# Patient Record
Sex: Female | Born: 1950
Health system: Southern US, Community
[De-identification: ages and names within clinical notes are randomized; demographics above are authoritative.]

## PROBLEM LIST (undated history)

## (undated) DIAGNOSIS — F419 Anxiety disorder, unspecified: Secondary | ICD-10-CM

## (undated) DIAGNOSIS — C541 Malignant neoplasm of endometrium: Secondary | ICD-10-CM

## (undated) DIAGNOSIS — R011 Cardiac murmur, unspecified: Secondary | ICD-10-CM

## (undated) DIAGNOSIS — F329 Major depressive disorder, single episode, unspecified: Secondary | ICD-10-CM

## (undated) DIAGNOSIS — R3915 Urgency of urination: Secondary | ICD-10-CM

## (undated) DIAGNOSIS — R42 Dizziness and giddiness: Secondary | ICD-10-CM

## (undated) DIAGNOSIS — IMO0002 Reserved for concepts with insufficient information to code with codable children: Secondary | ICD-10-CM

## (undated) DIAGNOSIS — F32A Depression, unspecified: Secondary | ICD-10-CM

## (undated) DIAGNOSIS — K219 Gastro-esophageal reflux disease without esophagitis: Secondary | ICD-10-CM

## (undated) DIAGNOSIS — E0500 Thyrotoxicosis with diffuse goiter without thyrotoxic crisis or storm: Secondary | ICD-10-CM

## (undated) DIAGNOSIS — R7303 Prediabetes: Secondary | ICD-10-CM

## (undated) DIAGNOSIS — IMO0001 Reserved for inherently not codable concepts without codable children: Secondary | ICD-10-CM

## (undated) DIAGNOSIS — M1990 Unspecified osteoarthritis, unspecified site: Secondary | ICD-10-CM

## (undated) DIAGNOSIS — M7989 Other specified soft tissue disorders: Secondary | ICD-10-CM

## (undated) HISTORY — PX: KNEE SURGERY: SHX244

## (undated) HISTORY — PX: CHOLECYSTECTOMY: SHX55

## (undated) HISTORY — PX: COLONOSCOPY: SHX174

---

## 2007-09-26 ENCOUNTER — Emergency Department (HOSPITAL_COMMUNITY): Admission: EM | Admit: 2007-09-26 | Discharge: 2007-09-27 | Payer: Self-pay | Admitting: *Deleted

## 2010-05-30 ENCOUNTER — Encounter: Payer: Self-pay | Admitting: Family Medicine

## 2011-09-17 ENCOUNTER — Ambulatory Visit (INDEPENDENT_AMBULATORY_CARE_PROVIDER_SITE_OTHER): Payer: BC Managed Care – PPO | Admitting: Family Medicine

## 2011-09-17 ENCOUNTER — Ambulatory Visit: Payer: BC Managed Care – PPO

## 2011-09-17 VITALS — BP 109/76 | HR 65 | Temp 97.4°F | Resp 18 | Ht 62.0 in | Wt 214.0 lb

## 2011-09-17 DIAGNOSIS — M25539 Pain in unspecified wrist: Secondary | ICD-10-CM

## 2011-09-17 DIAGNOSIS — M25519 Pain in unspecified shoulder: Secondary | ICD-10-CM

## 2011-09-17 DIAGNOSIS — M25019 Hemarthrosis, unspecified shoulder: Secondary | ICD-10-CM

## 2011-09-17 MED ORDER — MELOXICAM 7.5 MG PO TABS: 7.5000 mg | ORAL_TABLET | Freq: Every day | ORAL | Status: AC

## 2011-09-17 NOTE — Progress Notes (Signed)
  Patient Name: Kendra Fuentes Date of Birth: 06/25/1950 Medical Record Number: [id number] Gender: female Date of Encounter: 09/17/2011  History of Present Illness:  Kendra Fuentes is a 61 y.o. very pleasant female patient who presents with the following:  Here today to evaluate a left shoulder injury. 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   No other injury that she is aware of.  She had a broken collar bone in her teens - no surgery needed for this.  She is otherwise healthy and unhurt- no head injury  There is no problem list on file for this patient.  No past medical history on file. No past surgical history on file. History  Substance Use Topics  . Smoking status: Never Smoker   . Smokeless tobacco: Not on file  . Alcohol Use: Not on file   No family history on file. Allergies  Allergen Reactions  . Penicillins     Medication list has been reviewed and updated.  Review of Systems: As per HPI- otherwise negative.   Physical Examination: Filed Vitals:   09/17/11 1058  BP: 109/76  Pulse: 65  Temp: 97.4 F (36.3 C)  TempSrc: Oral  Resp: 18  Height: 5\' 2"  (1.575 m)  Weight: 214 lb (97.07 kg)    Body mass index is 39.14 kg/(m^2).  GEN: WDWN, NAD, Non-toxic, A & O x 3, obese HEENT: Atraumatic, Normocephalic. Neck supple. No masses, No LAD. Ears and Nose: No external deformity. CV: RRR, No M/G/R. No JVD. No thrill. No extra heart sounds. PULM: CTA B, no wheezes, crackles, rhonchi. No retractions. No resp. distress. No accessory muscle use. EXTR: No c/c/e NEURO Normal gait.  PSYCH: Normally interactive. Conversant. Not depressed or anxious appearing.  Calm demeanor.  Left shoulder: tender at anterior RCT insertion.  Negative  ACJ.  Some discomfort with extreme abduction and flexion, but full ROM.  Shoulder is located.  Normal strength, sensation  and biceps reflex in arm.  No redness or swelling  [facility] reading (PRIMARY) by  Dr. Patsy Lager.  Negative left humerus and shoulder  LEFT HUMERUS - 2+ VIEW  Comparison: None  Findings: There is no evidence of fracture or dislocation. There is no evidence of arthropathy or other focal bone abnormality. Soft tissues are unremarkable.  IMPRESSION: Negative exam.  Clinically significant discrepancy from primary report, if provided: None  LEFT SHOULDER - 2+ VIEW  Comparison: None.  Findings: No acute fracture or dislocation identified. Mild degenerative changes involving the [facility] joint and glenohumeral joint. Soft tissues are unremarkable.  IMPRESSION: No acute fracture.  Clinically significant discrepancy from primary report, if provided: None  Assessment and Plan: 1. Shoulder pain  DG Shoulder Left, DG Humerus Left, meloxicam (MOBIC) 7.5 MG tablet   Shoulder contusion and strain.  Sling as needed for comfort, but do not wear all the time- need to maintain shoulder ROM.  She may use mobic as needed, and will let me know if not better in a few days.  If symptoms persist may need further evlauation of her rotator cuff

## 2013-01-21 ENCOUNTER — Emergency Department (HOSPITAL_COMMUNITY)
Admission: EM | Admit: 2013-01-21 | Discharge: 2013-01-21 | Disposition: A | Discharge status: Home / Self Care | Payer: BC Managed Care – PPO | Attending: Emergency Medicine | Admitting: Emergency Medicine

## 2013-01-21 ENCOUNTER — Encounter (HOSPITAL_COMMUNITY): Payer: Self-pay | Admitting: *Deleted

## 2013-01-21 DIAGNOSIS — R42 Dizziness and giddiness: Secondary | ICD-10-CM | POA: Insufficient documentation

## 2013-01-21 DIAGNOSIS — Z862 Personal history of diseases of the blood and blood-forming organs and certain disorders involving the immune mechanism: Secondary | ICD-10-CM | POA: Insufficient documentation

## 2013-01-21 DIAGNOSIS — Z880 Allergy status to penicillin: Secondary | ICD-10-CM | POA: Insufficient documentation

## 2013-01-21 DIAGNOSIS — W19XXXA Unspecified fall, initial encounter: Secondary | ICD-10-CM

## 2013-01-21 DIAGNOSIS — T148XXA Other injury of unspecified body region, initial encounter: Secondary | ICD-10-CM

## 2013-01-21 DIAGNOSIS — IMO0002 Reserved for concepts with insufficient information to code with codable children: Secondary | ICD-10-CM | POA: Insufficient documentation

## 2013-01-21 DIAGNOSIS — Z8639 Personal history of other endocrine, nutritional and metabolic disease: Secondary | ICD-10-CM | POA: Insufficient documentation

## 2013-01-21 DIAGNOSIS — Y9351 Activity, roller skating (inline) and skateboarding: Secondary | ICD-10-CM | POA: Insufficient documentation

## 2013-01-21 DIAGNOSIS — Y9239 Other specified sports and athletic area as the place of occurrence of the external cause: Secondary | ICD-10-CM | POA: Insufficient documentation

## 2013-01-21 DIAGNOSIS — Z79899 Other long term (current) drug therapy: Secondary | ICD-10-CM | POA: Insufficient documentation

## 2013-01-21 HISTORY — DX: Thyrotoxicosis with diffuse goiter without thyrotoxic crisis or storm: E05.00

## 2013-01-21 LAB — CBC WITH DIFFERENTIAL/PLATELET
Basophils Absolute: 0 10*3/uL (ref 0.0–0.1)
Basophils Relative: 0 % (ref 0–1)
Lymphocytes Relative: 8 % — ABNORMAL LOW (ref 12–46)
MCHC: 33.1 g/dL (ref 30.0–36.0)
Neutro Abs: 9.3 10*3/uL — ABNORMAL HIGH (ref 1.7–7.7)
Platelets: 257 10*3/uL (ref 150–400)
RDW: 13.9 % (ref 11.5–15.5)
WBC: 11.2 10*3/uL — ABNORMAL HIGH (ref 4.0–10.5)

## 2013-01-21 LAB — BASIC METABOLIC PANEL
CO2: 27 mEq/L (ref 19–32)
Calcium: 9.9 mg/dL (ref 8.4–10.5)
Chloride: 102 mEq/L (ref 96–112)
GFR calc Af Amer: 63 mL/min — ABNORMAL LOW (ref >90)
Sodium: 137 mEq/L (ref 135–145)

## 2013-01-21 LAB — TROPONIN I: Troponin I: <0.3 ng/mL (ref <0.30)

## 2013-01-21 MED ORDER — KETOROLAC TROMETHAMINE 30 MG/ML IJ SOLN: 30.0000 mg | Freq: Once | INTRAMUSCULAR | Status: DC

## 2013-01-21 MED ORDER — OXYCODONE-ACETAMINOPHEN 5-325 MG PO TABS: 1.0000 | ORAL_TABLET | ORAL | Status: DC | PRN

## 2013-01-21 MED ORDER — ONDANSETRON 8 MG PO TBDP
8.0000 mg | ORAL_TABLET | Freq: Once | ORAL | Status: AC
  Administered 2013-01-21: 8 mg via ORAL
  Filled 2013-01-21: qty 1

## 2013-01-21 MED ORDER — MORPHINE SULFATE 4 MG/ML IJ SOLN: 4.0000 mg | Freq: Once | INTRAMUSCULAR | Status: DC

## 2013-01-21 MED ORDER — IBUPROFEN 800 MG PO TABS
800.0000 mg | ORAL_TABLET | Freq: Once | ORAL | Status: AC
  Administered 2013-01-21: 800 mg via ORAL
  Filled 2013-01-21: qty 1

## 2013-01-21 MED ORDER — SODIUM CHLORIDE 0.9 % IV BOLUS (SEPSIS): 1000.0000 mL | Freq: Once | INTRAVENOUS | Status: DC

## 2013-01-21 MED ORDER — CYCLOBENZAPRINE HCL 10 MG PO TABS: 10.0000 mg | ORAL_TABLET | Freq: Two times a day (BID) | ORAL | Status: DC | PRN

## 2013-01-21 MED ORDER — ONDANSETRON 4 MG PO TBDP: ORAL_TABLET | ORAL | Status: DC

## 2013-01-21 MED ORDER — IBUPROFEN 800 MG PO TABS: 800.0000 mg | ORAL_TABLET | Freq: Three times a day (TID) | ORAL | Status: DC

## 2013-01-21 MED ORDER — OXYCODONE-ACETAMINOPHEN 5-325 MG PO TABS
2.0000 | ORAL_TABLET | Freq: Once | ORAL | Status: AC
  Administered 2013-01-21: 2 via ORAL
  Filled 2013-01-21: qty 2

## 2013-01-21 NOTE — ED Provider Notes (Signed)
CSN: [id number]     Arrival date & time 01/21/13  1955 History   First MD Initiated Contact with Patient 01/21/13 2042     Chief Complaint  Patient presents with  . Fall   (Consider location/radiation/quality/duration/timing/severity/associated sxs/prior Treatment) The history is provided by the patient.  Shernell Saldierna is a 62 y.o. female history of Graves' disease here presenting with L buttock pain after fall and lightheadedness. 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 Denies any head injury or back injury. Plenty of pain radiating down from the left buttock to  her calf. She initially was hypotensive in the scene and felt like she gone passed out but now feels better. Denies headache or abdominal pain or vomiting.   Past Medical History  Diagnosis Date  . Graves disease    Past Surgical History  Procedure Laterality Date  . Cholecystectomy    . Knee surgery     No family history on file. History  Substance Use Topics  . Smoking status: Never Smoker   . Smokeless tobacco: Not on file  . Alcohol Use: No   OB History   Grav Para Term Preterm Abortions TAB SAB Ect Mult Living                 Review of Systems  Musculoskeletal:       L leg pain   All other systems reviewed and are negative.    Allergies  Penicillins  Home Medications   Current Outpatient Rx  Name  Route  Sig  Dispense  Refill  . citalopram (CELEXA) 20 MG tablet   Oral   Take 20 mg by mouth daily.         . diphenhydrAMINE (BENADRYL) 25 mg capsule   Oral   Take 25 mg by mouth every 6 (six) hours as needed for itching.         . levothyroxine (SYNTHROID, LEVOTHROID) 150 MCG tablet   Oral   Take 150 mcg by mouth daily before breakfast.          BP 136/63  Pulse 91  Temp(Src) 98.2 F (36.8 C) (Oral)  Resp 16  SpO2 100% Physical Exam  Nursing note and vitals reviewed. Constitutional: She is oriented to person, place, and  time. She appears well-developed and well-nourished.  Slightly uncomfortable   HENT:  Head: Normocephalic and atraumatic.  Mouth/Throat: Oropharynx is clear and moist.  Eyes: Conjunctivae are normal. Pupils are equal, round, and reactive to light.  Neck: Normal range of motion. Neck supple.  Cardiovascular: Normal rate, regular rhythm and normal heart sounds.   Pulmonary/Chest: Effort normal and breath sounds normal. No respiratory distress. She has no wheezes. She has no rales.  Abdominal: Soft. Bowel sounds are normal. She exhibits no distension. There is no tenderness. There is no rebound and no guarding.  Musculoskeletal:  No midline spinal tenderness. + tenderness L buttock area.   Neurological: She is alert and oriented to person, place, and time.  + straight leg raise L leg. + tenderness L inner thigh. Nl sensation and nl motor function.   Skin: Skin is warm and dry.  Psychiatric: She has a normal mood and affect. Her behavior is normal. Judgment and thought content normal.    ED Course  Procedures (including critical care time) Labs Review Labs Reviewed  CBC WITH DIFFERENTIAL - Abnormal; Notable for the following:    WBC 11.2 (*)    Neutrophils  Relative % 83 (*)    Neutro Abs 9.3 (*)    Lymphocytes Relative 8 (*)    All other components within normal limits  BASIC METABOLIC PANEL - Abnormal; Notable for the following:    Glucose, Bld 140 (*)    GFR calc non Af Amer 55 (*)    GFR calc Af Amer 63 (*)    All other components within normal limits  TROPONIN I   Imaging Review No results found.   Date: 01/21/2013  Rate: 66  Rhythm: normal sinus rhythm  QRS Axis: normal  Intervals: normal  ST/T Wave abnormalities: nonspecific ST changes  Conduction Disutrbances:none  Narrative Interpretation:   Old EKG Reviewed: none available    MDM  No diagnosis found. Yanissa Michalsky is a 62 y.o. female here with fall with L leg pain. Likely sciatica from muscle strain. I wanted to  hydrate patient and get labs. However, she really doesn't want needles and said that she didn't pass out and was overwhelmed after the fall. Will give pain meds and check orthostatics.   11:07 PM She was orthostatic but still doesn't want IVF and drank some PO fluid instead. Labs normal except borderline elevated glucose. Pain improved, able to ambulate. Will d/c home on motrin, percocet, flexeril, prn zofran. She will f/u with pmd in a week for glucose check and reassessment.      Richardean Canal, MD 01/21/13 [phone]

## 2013-01-21 NOTE — ED Notes (Signed)
Pt roller skating; tripped and did a split; feels like pulled muscles from left buttock down to left knee; numb and painful; pt feeling like she was going to pass out after fall; hypotensive when standing in triage; states continues to feel like she is going to pass out

## 2013-09-16 ENCOUNTER — Emergency Department (HOSPITAL_COMMUNITY): Payer: BC Managed Care – PPO

## 2013-09-16 ENCOUNTER — Encounter (HOSPITAL_COMMUNITY): Payer: Self-pay | Admitting: Radiology

## 2013-09-16 ENCOUNTER — Emergency Department (HOSPITAL_COMMUNITY)
Admission: EM | Admit: 2013-09-16 | Discharge: 2013-09-16 | Disposition: A | Discharge status: Home / Self Care | Payer: BC Managed Care – PPO | Attending: Emergency Medicine | Admitting: Emergency Medicine

## 2013-09-16 DIAGNOSIS — Z880 Allergy status to penicillin: Secondary | ICD-10-CM | POA: Insufficient documentation

## 2013-09-16 DIAGNOSIS — Y9239 Other specified sports and athletic area as the place of occurrence of the external cause: Secondary | ICD-10-CM | POA: Insufficient documentation

## 2013-09-16 DIAGNOSIS — S59919A Unspecified injury of unspecified forearm, initial encounter: Secondary | ICD-10-CM

## 2013-09-16 DIAGNOSIS — Y9389 Activity, other specified: Secondary | ICD-10-CM | POA: Insufficient documentation

## 2013-09-16 DIAGNOSIS — Z79899 Other long term (current) drug therapy: Secondary | ICD-10-CM | POA: Insufficient documentation

## 2013-09-16 DIAGNOSIS — R296 Repeated falls: Secondary | ICD-10-CM | POA: Insufficient documentation

## 2013-09-16 DIAGNOSIS — S6990XA Unspecified injury of unspecified wrist, hand and finger(s), initial encounter: Secondary | ICD-10-CM

## 2013-09-16 DIAGNOSIS — S52599A Other fractures of lower end of unspecified radius, initial encounter for closed fracture: Secondary | ICD-10-CM | POA: Insufficient documentation

## 2013-09-16 DIAGNOSIS — S59909A Unspecified injury of unspecified elbow, initial encounter: Secondary | ICD-10-CM | POA: Insufficient documentation

## 2013-09-16 DIAGNOSIS — E0500 Thyrotoxicosis with diffuse goiter without thyrotoxic crisis or storm: Secondary | ICD-10-CM | POA: Insufficient documentation

## 2013-09-16 DIAGNOSIS — S52502A Unspecified fracture of the lower end of left radius, initial encounter for closed fracture: Secondary | ICD-10-CM

## 2013-09-16 DIAGNOSIS — Y92838 Other recreation area as the place of occurrence of the external cause: Secondary | ICD-10-CM

## 2013-09-16 MED ORDER — OXYCODONE-ACETAMINOPHEN 5-325 MG PO TABS
2.0000 | ORAL_TABLET | Freq: Once | ORAL | Status: AC
  Administered 2013-09-16: 2 via ORAL
  Filled 2013-09-16: qty 2

## 2013-09-16 MED ORDER — HYDROMORPHONE HCL PF 1 MG/ML IJ SOLN
1.0000 mg | Freq: Once | INTRAMUSCULAR | Status: AC
  Administered 2013-09-16: 1 mg via INTRAVENOUS
  Filled 2013-09-16: qty 1

## 2013-09-16 MED ORDER — OXYCODONE-ACETAMINOPHEN 5-325 MG PO TABS: 1.0000 | ORAL_TABLET | ORAL | Status: DC | PRN

## 2013-09-16 MED ORDER — IBUPROFEN 800 MG PO TABS: 800.0000 mg | ORAL_TABLET | Freq: Three times a day (TID) | ORAL | Status: DC

## 2013-09-16 MED ORDER — ONDANSETRON 4 MG PO TBDP: 4.0000 mg | ORAL_TABLET | ORAL | Status: AC | PRN

## 2013-09-16 MED ORDER — ONDANSETRON 4 MG PO TBDP
4.0000 mg | ORAL_TABLET | Freq: Once | ORAL | Status: AC
  Administered 2013-09-16: 4 mg via ORAL
  Filled 2013-09-16: qty 1

## 2013-09-16 NOTE — ED Notes (Signed)
Pt transported to radiology.

## 2013-09-16 NOTE — ED Notes (Signed)
Ortho at bedside.

## 2013-09-16 NOTE — ED Notes (Signed)
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

## 2013-09-16 NOTE — Discharge Instructions (Signed)
Splint Care Splints protect and rest injuries. Splints can be made of plaster, fiberglass, or metal. They are used to treat broken bones, sprains, tendonitis, and other injuries. HOME CARE  Keep the injured area raised (elevated) while sitting or lying down. Keep the injured body part just above the level of the heart. This will decrease puffiness (swelling) and pain.  If an elastic bandage was used to hold the splint, it can be loosened. Only loosen it to make room for puffiness and to ease pain.  Keep the splint clean and dry.  Do not scratch the skin under the splint with sharp or pointed objects.  Follow up with your doctor as told. GET HELP RIGHT AWAY IF:   There is more pain or pressure around the injury.  There is numbness, tingling, or pain in the toes or fingers past the injury.  The fingers or toes become cold or blue.  The splint becomes too soft or breaks before the injury is healed. MAKE SURE YOU:   Understand these instructions.  Will watch this condition.  Will get help right away if you are not doing well or get worse. Document Released: 02/02/2008 Document Revised: 07/18/2011 Document Reviewed: 02/02/2008 [facility] Patient Information 2014 [place].       Acute Compartment Syndrome Compartment syndrome is a painful condition that occurs when swelling and pressure build up in a body space (compartment) of the arms or legs. Groups of muscles, nerves, and blood vessels in the arms and legs are separated into various compartments. Each compartment is surrounded by tough layers of tissue called fascia. In compartment syndrome, pressure builds up within the layers of fascia and begins to push on the structures within that compartment.  In acute compartment syndrome, the pressure builds up suddenly, often as the result of an injury. This is a surgical emergency. When a muscle in the compartment moves, you may feel severe pain. If pressure continues to increase,  it can block the flow of blood in the smallest blood vessels (capillaries). Then, the nerves and muscles in the compartment cannot get enough oxygen and nutrients (substances needed for survival). They will start to die within 4 8 hours. That is why the pressure needs to be relieved immediately. Identifying the condition early and treating it quickly can prevent most problems. CAUSES  Various things can lead to compartment syndrome. Possible causes include:   Injury. Some injuries can cause swelling or bleeding in a compartment. This can lead to compartment syndrome. Injuries that may cause this problem include:  Broken bones, especially the long bones of the arms and legs.  Crushing injuries.  Penetrating injuries, such as a knife wound that punctures the skin and tissue underneath.  Badly bruised muscles.  Poisonous bites, such as a snake bite.  Severe burns.  Blocked blood flow. This could result from:  A cast or bandage that is too tight.  A surgical procedure. Blood flow sometimes has to be stopped for a while during a surgery, usually with a tourniquet.  Lying for too long in a position that restricts blood flow. This can happen in people who have nerve damage or if a person is unconscious for a long time.  Drugs used to build up muscles (anabolic steroids).  Drugs that keep the blood from forming clots (blood thinners). SIGNS AND SYMPTOMS  The most common symptom of compartment syndrome is pain. The pain may:   Get worse when moving or stretching the affected body part.  Be more  severe than it should be for an injury.  Come along with a feeling of tingling or burning.  Become worse when the area is pushed or squeezed.  Be unaffected by pain medicine. Other symptoms include:   A feeling of tightness or fullness in the affected area.   A loss of feeling.  Weakness in the area.  Loss of movement.  Skin becoming pale, tight, and shiny over the painful area.   DIAGNOSIS  Your health care provider may suspect the problem based on how you describe the pain. The diagnosis is made by using a special device that measures the pressure in the affected area. Blood tests, X-rays, or an ultrasound exam may be done to help rule out other problems.  TREATMENT  Compartment syndrome is a surgical emergency. It should be treated very quickly.   First-aid treatment is given first. This may include:  Promptly treating an injury.  Loosening or removing any cast, bandage, or external wrap that may be causing pain.  Raising the painful arm or leg to the same level as the heart.  Giving oxygen.  Giving fluid through an IV access tube that is put into a vein in the hand or arm.  Surgery (fasciotomy) is needed to relieve the pressure and help prevent permanent damage. In this surgery, cuts (incisions) are made through the fascia to relieve the pressure in the compartment. Document Released: 04/13/2009 Document Revised: 12/26/2012 Document Reviewed: 11/27/2012 [facility] Patient Information 2014 [place].

## 2013-09-16 NOTE — Progress Notes (Signed)
Orthopedic Tech Progress Note Patient Details:  Kendra Fuentes 1950-08-04 [id number]  Ortho Devices Type of Ortho Device: Ace wrap;Arm sling;Sugartong splint Ortho Device/Splint Location: LUE Ortho Device/Splint Interventions: Ordered;Application   Braulio Bosch 09/16/2013, 9:45 PM

## 2013-09-16 NOTE — ED Provider Notes (Signed)
CSN: [id number]     Arrival date & time 09/16/13  1845 History   First MD Initiated Contact with Patient 09/16/13 1855     Chief Complaint  Patient presents with  . Arm Pain     (Consider location/radiation/quality/duration/timing/severity/associated sxs/prior Treatment) HPI 63 year old female presents after falling. 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 She fell and landed on her left arm it went behind her. She's having pain mostly in her left wrist but also all the way up to her left shoulder. The 2 worst areas of pain are her left wrist and left scapula. The pain is currently a 10 out of 10, despite getting better my grams of fentanyl by EMS. She has normal sensation and normal but painful movement in her fingers. Denies hitting her head or neck. No other symptoms at this time.  Past Medical History  Diagnosis Date  . Graves disease    Past Surgical History  Procedure Laterality Date  . Cholecystectomy    . Knee surgery     No family history on file. History  Substance Use Topics  . Smoking status: Never Smoker   . Smokeless tobacco: Not on file  . Alcohol Use: No   OB History   Grav Para Term Preterm Abortions TAB SAB Ect Mult Living                 Review of Systems  Musculoskeletal: Positive for joint swelling. Negative for neck pain.  Skin: Negative for wound.  Neurological: Negative for weakness, numbness and headaches.  All other systems reviewed and are negative.     Allergies  Penicillins  Home Medications   Prior to Admission medications   Medication Sig Start Date End Date Taking? Authorizing Provider  citalopram (CELEXA) 20 MG tablet Take 20 mg by mouth daily.    Historical Provider, MD  cyclobenzaprine (FLEXERIL) 10 MG tablet Take 1 tablet (10 mg total) by mouth 2 (two) times daily as needed for muscle spasms. 01/21/13   Wandra Arthurs, MD  diphenhydrAMINE (BENADRYL) 25 mg capsule  Take 25 mg by mouth every 6 (six) hours as needed for itching.    Historical Provider, MD  ibuprofen (ADVIL,MOTRIN) 800 MG tablet Take 1 tablet (800 mg total) by mouth 3 (three) times daily. 01/21/13   Wandra Arthurs, MD  levothyroxine (SYNTHROID, LEVOTHROID) 150 MCG tablet Take 150 mcg by mouth daily before breakfast.    Historical Provider, MD  ondansetron (ZOFRAN ODT) 4 MG disintegrating tablet 4mg  ODT q4 hours prn nausea/vomit 01/21/13   Wandra Arthurs, MD  oxyCODONE-acetaminophen (PERCOCET) 5-325 MG per tablet Take 1 tablet by mouth every 4 (four) hours as needed for pain. 01/21/13   Wandra Arthurs, MD   SpO2 100% Physical Exam  Nursing note and vitals reviewed. Constitutional: She is oriented to person, place, and time. She appears well-developed and well-nourished.  HENT:  Head: Normocephalic and atraumatic.  Right Ear: External ear normal.  Left Ear: External ear normal.  Nose: Nose normal.  Eyes: Right eye exhibits no discharge. Left eye exhibits no discharge.  Cardiovascular: Normal rate, regular rhythm and normal heart sounds.   Pulses:      Radial pulses are 2+ on the right side, and 2+ on the left side.  Pulmonary/Chest: Effort normal.  Abdominal: Soft. She exhibits no distension.  Musculoskeletal:       Left elbow: She exhibits no swelling and no  deformity. Tenderness found.       Left wrist: She exhibits tenderness, bony tenderness, swelling and deformity. She exhibits no laceration.       Back:       Left upper arm: She exhibits tenderness. She exhibits no deformity.       Left forearm: She exhibits tenderness and bony tenderness.  Patient with normal sensation, cap refill and limited movements of her left hand/fingers. Limited due to pain.  Neurological: She is alert and oriented to person, place, and time.  Skin: Skin is warm and dry.    ED Course  Procedures (including critical care time) Labs Review Labs Reviewed - No data to display  Imaging Review Dg Scapula  Left  09/16/2013   CLINICAL DATA:  Left shoulder pain  EXAM: LEFT SCAPULA - 2+ VIEWS  COMPARISON:  None.  FINDINGS: There is no evidence of fracture or other focal bone lesions. Soft tissues are unremarkable.  IMPRESSION: No acute abnormality noted.   Electronically Signed   By: Inez Catalina M.D.   On: 09/16/2013 20:33   Dg Forearm Left  09/16/2013   CLINICAL DATA:  Status post fall with pain in the left ribs.  EXAM: LEFT FOREARM - 2 VIEW  COMPARISON:  Left wrist x-ray  FINDINGS: There is comminuted displaced intra-articular fracture of the distal radius. There is impaction and posterior angulation at the fracture site.  IMPRESSION: Comminuted displaced intra-articular fracture of distal radius.   Electronically Signed   By: Abelardo Diesel M.D.   On: 09/16/2013 20:33   Dg Wrist Complete Left  09/16/2013   CLINICAL DATA:  Traumatic injury and pain  EXAM: LEFT WRIST - COMPLETE 3+ VIEW  COMPARISON:  None.  FINDINGS: There is a comminuted fracture of the distal radius involving the radiocarpal articulation. Impaction and posterior angulation is noted at the fracture site. Additionally there is regularity in the midportion of the scaphoid bone suggestive of minimally displaced fracture. No other focal abnormality is noted.  IMPRESSION: Comminuted distal radial fracture.  Changes suggestive of undisplaced mid scaphoid fracture.   Electronically Signed   By: Inez Catalina M.D.   On: 09/16/2013 20:29   Ct Wrist Left Wo Contrast  09/16/2013   CLINICAL DATA:  Evaluate scaphoid for fracture.  EXAM: CT OF THE LEFT WRIST WITHOUT CONTRAST  TECHNIQUE: Multidetector CT imaging was performed according to the standard protocol. Multiplanar CT image reconstructions were also generated.  COMPARISON:  DG FOREARM*L* dated 09/16/2013; DG WRIST COMPLETE*L* dated 09/16/2013  FINDINGS: Comminuted displaced and impacted fractures of the distal left radius with fracture lines extending to the radiocarpal joint. Cystic change in the  distal ulna without evidence of ulnar styloid process fracture. There is focal cortical irregularity and linear lucency in the waist of the scaphoid bone suggesting a nondisplaced scaphoid fracture. Carpal bones appear otherwise intact.  IMPRESSION: Comminuted, displaced, and impacted fractures of the distal left radius. Focal cortical irregularity and linear lucency in the waist of the scaphoid suggesting nondisplaced fracture.   Electronically Signed   By: Lucienne Capers M.D.   On: 09/16/2013 22:23   Dg Humerus Left  09/16/2013   CLINICAL DATA:  Recent traumatic injury with humeral pain  EXAM: LEFT HUMERUS - 2+ VIEW  COMPARISON:  None.  FINDINGS: There is no evidence of fracture or other focal bone lesions. Soft tissues are unremarkable. Mild degenerative changes are noted with a high-riding humeral head. This may be related to underlying rotator cuff injury of a chronic nature.  IMPRESSION: Question chronic rotator cuff injury. No acute bony abnormality is seen.   Electronically Signed   By: Inez Catalina M.D.   On: 09/16/2013 20:34     EKG Interpretation None      MDM   Final diagnoses:  Distal radius fracture, left    Patient with distal radius and scaphoid fracture as above. Her ulna is intact. Discussed her case with Dr. Grandville Silos, hand surgeon on call, who recommends placing a sugar tong splint and getting a CT scan for operative purposes. He will follow up with the patient next 2-3 days and schedule her for surgery. I relayed this to the patient, and we'll give the patient oral and IV pain control. Patient's pain is better here and she is neurovascular intact. No signs of median nerve injury. No signs of any other concerning injuries.    Ephraim Hamburger, MD 09/16/13 [phone]

## 2013-09-16 NOTE — ED Notes (Signed)
Pt to xray

## 2013-09-17 ENCOUNTER — Other Ambulatory Visit: Payer: Self-pay | Admitting: Orthopedic Surgery

## 2013-09-18 ENCOUNTER — Encounter (HOSPITAL_BASED_OUTPATIENT_CLINIC_OR_DEPARTMENT_OTHER): Payer: Self-pay | Admitting: *Deleted

## 2013-09-19 NOTE — H&P (Signed)
ALOURA MATSUOKA is an 63 y.o. female.   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  She was evaluated emergency department, sugar tong splint applied and a CT scan performed at my request.  She has done reasonably well, and reports additional pain on the lateral aspect of the upper arm.  She thinks that she fell also onto the arm.  She has had some shoulder problems in the past, and this may be an exacerbation.  Past Medical History  Diagnosis Date  . Graves disease   . Anxiety   . Depression   . GERD (gastroesophageal reflux disease)   . Arthritis     Past Surgical History  Procedure Laterality Date  . Cholecystectomy    . Knee surgery      No family history on file. Social History:  reports that she has never smoked. She does not have any smokeless tobacco history on file. She reports that she does not drink alcohol or use illicit drugs.  Allergies:  Allergies  Allergen Reactions  . Codeine Nausea And Vomiting  . Penicillins     Childhood allergy    No prescriptions prior to admission    No results found for this or any previous visit (from the past 48 hour(s)). No results found.  Review of Systems  All other systems reviewed and are negative.   Height 5\' 2"  (1.575 m), weight 97.07 kg (214 lb). Physical Exam  Constitutional:  WD, WN, NAD HEENT:  NCAT, EOMI Neuro/Psych:  Alert & oriented to person, place, and time; appropriate mood & affect Lymphatic: No generalized UE edema or lymphadenopathy Extremities / MSK:  Both UE are normal with respect to appearance, ranges of motion, joint stability, muscle strength/tone, sensation, & perfusion except as otherwise noted:  A left upper extremity is in a sugar tong splint.  Intact light touch sensation on the radial, median, and ulnar nerve  distributions with intact motor to the same.  Digital motion diminished.  There is tenderness to palpation along the lateral aspect of the upper arm.  Establishing the integrity of the rotator cuff is difficult at present given her other injuries  Labs / Xrays:  No radiographic studies obtained today.  Previous x-rays and CT scan are reviewed, revealing a displaced comminuted intra-articular distal radius fracture, as well as likely a nondisplaced fracture of the scaphoid waist.  Assessment: Left scaphoid and distal radius fracture  Plan:  I discussed these findings with her.  She was instructed in elevation of the hand, range of motion exercises for the digits, and a brief operative plan that includes volar plating of the comminuted distal radius fracture as well as fixation of the scaphoid, hopefully with a dorsal percutaneous approach.   We will continue to reassess the pain in her upper arm, and I indicated that additional injury at that level could not be fully excluded, despite negative x-rays.  The details of the operative procedure were discussed with the patient.  Questions were invited and answered.  In addition to the goal of the procedure, the risks of the procedure to include but not limited to bleeding; infection; damage to the nerves or blood vessels that could result in bleeding, numbness, weakness, chronic pain, and the need for additional procedures; stiffness; the need for revision surgery; and anesthetic risks,  the worst of which is death, were reviewed.  No specific outcome was guaranteed or implied.  Informed consent was obtained.  Prescriptions for postoperative analgesia were also written.  We are planning to proceed with surgery on Monday.  Jolyn Nap 09/19/2013, 3:20 PM

## 2013-09-23 ENCOUNTER — Encounter (HOSPITAL_BASED_OUTPATIENT_CLINIC_OR_DEPARTMENT_OTHER)
Admission: RE | Disposition: A | Discharge status: Home / Self Care | Payer: Self-pay | Source: Ambulatory Visit | Attending: Orthopedic Surgery

## 2013-09-23 ENCOUNTER — Ambulatory Visit (HOSPITAL_BASED_OUTPATIENT_CLINIC_OR_DEPARTMENT_OTHER): Payer: BC Managed Care – PPO | Admitting: Certified Registered"

## 2013-09-23 ENCOUNTER — Encounter (HOSPITAL_BASED_OUTPATIENT_CLINIC_OR_DEPARTMENT_OTHER): Payer: Self-pay | Admitting: *Deleted

## 2013-09-23 ENCOUNTER — Ambulatory Visit (HOSPITAL_COMMUNITY): Payer: BC Managed Care – PPO

## 2013-09-23 ENCOUNTER — Ambulatory Visit (HOSPITAL_BASED_OUTPATIENT_CLINIC_OR_DEPARTMENT_OTHER)
Admission: RE | Admit: 2013-09-23 | Discharge: 2013-09-23 | Disposition: A | Discharge status: Home / Self Care | Payer: BC Managed Care – PPO | Source: Ambulatory Visit | Attending: Orthopedic Surgery | Admitting: Orthopedic Surgery

## 2013-09-23 ENCOUNTER — Encounter (HOSPITAL_BASED_OUTPATIENT_CLINIC_OR_DEPARTMENT_OTHER): Payer: BC Managed Care – PPO | Admitting: Certified Registered"

## 2013-09-23 DIAGNOSIS — K219 Gastro-esophageal reflux disease without esophagitis: Secondary | ICD-10-CM | POA: Insufficient documentation

## 2013-09-23 DIAGNOSIS — F329 Major depressive disorder, single episode, unspecified: Secondary | ICD-10-CM | POA: Insufficient documentation

## 2013-09-23 DIAGNOSIS — E039 Hypothyroidism, unspecified: Secondary | ICD-10-CM | POA: Insufficient documentation

## 2013-09-23 DIAGNOSIS — F3289 Other specified depressive episodes: Secondary | ICD-10-CM | POA: Insufficient documentation

## 2013-09-23 DIAGNOSIS — F411 Generalized anxiety disorder: Secondary | ICD-10-CM | POA: Insufficient documentation

## 2013-09-23 DIAGNOSIS — S52599A Other fractures of lower end of unspecified radius, initial encounter for closed fracture: Secondary | ICD-10-CM | POA: Insufficient documentation

## 2013-09-23 DIAGNOSIS — S62009B Unspecified fracture of navicular [scaphoid] bone of unspecified wrist, initial encounter for open fracture: Secondary | ICD-10-CM | POA: Insufficient documentation

## 2013-09-23 DIAGNOSIS — W19XXXA Unspecified fall, initial encounter: Secondary | ICD-10-CM | POA: Insufficient documentation

## 2013-09-23 HISTORY — DX: Anxiety disorder, unspecified: F41.9

## 2013-09-23 HISTORY — DX: Unspecified osteoarthritis, unspecified site: M19.90

## 2013-09-23 HISTORY — PX: OPEN REDUCTION INTERNAL FIXATION (ORIF) DISTAL RADIAL FRACTURE: SHX5989

## 2013-09-23 HISTORY — DX: Major depressive disorder, single episode, unspecified: F32.9

## 2013-09-23 HISTORY — DX: Depression, unspecified: F32.A

## 2013-09-23 HISTORY — DX: Gastro-esophageal reflux disease without esophagitis: K21.9

## 2013-09-23 LAB — POCT HEMOGLOBIN-HEMACUE: Hemoglobin: 12.3 g/dL (ref 12.0–15.0)

## 2013-09-23 SURGERY — Surgical Case
Anesthesia: *Unknown

## 2013-09-23 SURGERY — OPEN REDUCTION INTERNAL FIXATION (ORIF) DISTAL RADIUS FRACTURE
Anesthesia: General | Laterality: Left

## 2013-09-23 MED ORDER — MIDAZOLAM HCL 5 MG/5ML IJ SOLN
INTRAMUSCULAR | Status: DC | PRN
  Administered 2013-09-23: 2 mg via INTRAVENOUS

## 2013-09-23 MED ORDER — OXYCODONE HCL 5 MG PO TABS
5.0000 mg | ORAL_TABLET | Freq: Once | ORAL | Status: AC | PRN
  Administered 2013-09-23: 5 mg via ORAL

## 2013-09-23 MED ORDER — CLINDAMYCIN PHOSPHATE 900 MG/50ML IV SOLN
INTRAVENOUS | Status: AC
  Filled 2013-09-23: qty 50

## 2013-09-23 MED ORDER — HYDROMORPHONE HCL PF 1 MG/ML IJ SOLN
INTRAMUSCULAR | Status: AC
  Filled 2013-09-23: qty 1

## 2013-09-23 MED ORDER — ONDANSETRON HCL 4 MG/2ML IJ SOLN: 4.0000 mg | Freq: Once | INTRAMUSCULAR | Status: DC | PRN

## 2013-09-23 MED ORDER — LIDOCAINE HCL (CARDIAC) 20 MG/ML IV SOLN
INTRAVENOUS | Status: DC | PRN
  Administered 2013-09-23: 30 mg via INTRAVENOUS

## 2013-09-23 MED ORDER — HYDROMORPHONE HCL PF 1 MG/ML IJ SOLN
0.2500 mg | INTRAMUSCULAR | Status: DC | PRN
  Administered 2013-09-23 (×3): 0.5 mg via INTRAVENOUS

## 2013-09-23 MED ORDER — 0.9 % SODIUM CHLORIDE (POUR BTL) OPTIME
TOPICAL | Status: DC | PRN
  Administered 2013-09-23: 300 mL

## 2013-09-23 MED ORDER — PROPOFOL 10 MG/ML IV EMUL
INTRAVENOUS | Status: AC
  Filled 2013-09-23: qty 100

## 2013-09-23 MED ORDER — FENTANYL CITRATE 0.05 MG/ML IJ SOLN
50.0000 ug | INTRAMUSCULAR | Status: DC | PRN
  Administered 2013-09-23: 100 ug via INTRAVENOUS

## 2013-09-23 MED ORDER — FENTANYL CITRATE 0.05 MG/ML IJ SOLN
INTRAMUSCULAR | Status: AC
  Filled 2013-09-23: qty 2

## 2013-09-23 MED ORDER — OXYCODONE HCL 5 MG/5ML PO SOLN: 5.0000 mg | Freq: Once | ORAL | Status: AC | PRN

## 2013-09-23 MED ORDER — OXYCODONE-ACETAMINOPHEN 5-325 MG PO TABS: 1.0000 | ORAL_TABLET | ORAL | Status: DC | PRN

## 2013-09-23 MED ORDER — MIDAZOLAM HCL 2 MG/2ML IJ SOLN
INTRAMUSCULAR | Status: AC
  Filled 2013-09-23: qty 2

## 2013-09-23 MED ORDER — CHLORHEXIDINE GLUCONATE 4 % EX LIQD: 60.0000 mL | Freq: Once | CUTANEOUS | Status: DC

## 2013-09-23 MED ORDER — CLINDAMYCIN PHOSPHATE 900 MG/50ML IV SOLN
900.0000 mg | INTRAVENOUS | Status: AC
  Administered 2013-09-23: 900 mg via INTRAVENOUS

## 2013-09-23 MED ORDER — MIDAZOLAM HCL 2 MG/2ML IJ SOLN
1.0000 mg | INTRAMUSCULAR | Status: DC | PRN
  Administered 2013-09-23: 2 mg via INTRAVENOUS

## 2013-09-23 MED ORDER — BUPIVACAINE-EPINEPHRINE (PF) 0.5% -1:200000 IJ SOLN
INTRAMUSCULAR | Status: DC | PRN
  Administered 2013-09-23: 25 mL

## 2013-09-23 MED ORDER — FENTANYL CITRATE 0.05 MG/ML IJ SOLN
INTRAMUSCULAR | Status: AC
  Filled 2013-09-23: qty 6

## 2013-09-23 MED ORDER — ONDANSETRON HCL 4 MG/2ML IJ SOLN
INTRAMUSCULAR | Status: DC | PRN
  Administered 2013-09-23: 4 mg via INTRAVENOUS

## 2013-09-23 MED ORDER — FENTANYL CITRATE 0.05 MG/ML IJ SOLN
INTRAMUSCULAR | Status: DC | PRN
  Administered 2013-09-23: 25 ug via INTRAVENOUS

## 2013-09-23 MED ORDER — SCOPOLAMINE 1 MG/3DAYS TD PT72
MEDICATED_PATCH | TRANSDERMAL | Status: AC
  Filled 2013-09-23: qty 1

## 2013-09-23 MED ORDER — PROPOFOL 10 MG/ML IV BOLUS
INTRAVENOUS | Status: DC | PRN
  Administered 2013-09-23: 200 mg via INTRAVENOUS

## 2013-09-23 MED ORDER — OXYCODONE HCL 5 MG PO TABS
ORAL_TABLET | ORAL | Status: AC
  Filled 2013-09-23: qty 1

## 2013-09-23 MED ORDER — LACTATED RINGERS IV SOLN
INTRAVENOUS | Status: DC
  Administered 2013-09-23 (×2): via INTRAVENOUS

## 2013-09-23 MED ORDER — DEXAMETHASONE SODIUM PHOSPHATE 10 MG/ML IJ SOLN
INTRAMUSCULAR | Status: DC | PRN
  Administered 2013-09-23: 10 mg via INTRAVENOUS

## 2013-09-23 MED ORDER — LACTATED RINGERS IV SOLN: INTRAVENOUS | Status: DC

## 2013-09-23 SURGICAL SUPPLY — 66 items
0.9 X 10 GUIDE ×2 IMPLANT
BANDAGE COBAN STERILE 2 (GAUZE/BANDAGES/DRESSINGS) IMPLANT
BIT DRILL MINI LNG ACUTRAK 2 (BIT) ×1 IMPLANT
BIT DRILL SOLID 2.0X40MM (BIT) ×1 IMPLANT
BIT DRILL SOLID 2.5X40MM (BIT) ×1 IMPLANT
BLADE 15 SAFETY STRL DISP (BLADE) IMPLANT
BLADE MINI RND TIP GREEN BEAV (BLADE) IMPLANT
BLADE SURG 15 STRL LF DISP TIS (BLADE) ×1 IMPLANT
BLADE SURG 15 STRL SS (BLADE) ×1
BNDG COHESIVE 4X5 TAN STRL (GAUZE/BANDAGES/DRESSINGS) ×2 IMPLANT
BNDG ESMARK 4X9 LF (GAUZE/BANDAGES/DRESSINGS) ×2 IMPLANT
BNDG GAUZE ELAST 4 BULKY (GAUZE/BANDAGES/DRESSINGS) ×4 IMPLANT
BRUSH SCRUB EZ PLAIN DRY (MISCELLANEOUS) ×2 IMPLANT
CANISTER SUCT 1200ML W/VALVE (MISCELLANEOUS) ×2 IMPLANT
CHLORAPREP W/TINT 26ML (MISCELLANEOUS) ×2 IMPLANT
CORDS BIPOLAR (ELECTRODE) ×2 IMPLANT
COVER MAYO STAND STRL (DRAPES) ×2 IMPLANT
COVER TABLE BACK 60X90 (DRAPES) ×2 IMPLANT
CUFF TOURNIQUET SINGLE 18IN (TOURNIQUET CUFF) ×2 IMPLANT
CUFF TOURNIQUET SINGLE 24IN (TOURNIQUET CUFF) IMPLANT
DRAPE C-ARM 42X72 X-RAY (DRAPES) ×2 IMPLANT
DRAPE EXTREMITY T 121X128X90 (DRAPE) ×2 IMPLANT
DRAPE SURG 17X23 STRL (DRAPES) ×2 IMPLANT
DRILL MINI LNG ACUTRAK 2 (BIT) ×2
DRILL SOLID 2.0X40MM (BIT) ×2
DRILL SOLID 2.5X40MM (BIT) ×2
DRSG ADAPTIC 3X8 NADH LF (GAUZE/BANDAGES/DRESSINGS) ×2 IMPLANT
DRSG EMULSION OIL 3X3 NADH (GAUZE/BANDAGES/DRESSINGS) IMPLANT
ELECT REM PT RETURN 9FT ADLT (ELECTROSURGICAL) ×2
ELECTRODE REM PT RTRN 9FT ADLT (ELECTROSURGICAL) ×1 IMPLANT
GAUZE SPONGE 4X4 12PLY STRL (GAUZE/BANDAGES/DRESSINGS) ×2 IMPLANT
GLOVE BIO SURGEON STRL SZ7.5 (GLOVE) ×2 IMPLANT
GLOVE BIOGEL PI IND STRL 7.0 (GLOVE) ×1 IMPLANT
GLOVE BIOGEL PI IND STRL 8 (GLOVE) ×1 IMPLANT
GLOVE BIOGEL PI INDICATOR 7.0 (GLOVE) ×1
GLOVE BIOGEL PI INDICATOR 8 (GLOVE) ×1
GLOVE ECLIPSE 6.5 STRL STRAW (GLOVE) ×2 IMPLANT
GLOVE EXAM NITRILE MD LF STRL (GLOVE) ×2 IMPLANT
GOWN STRL REUS W/ TWL LRG LVL3 (GOWN DISPOSABLE) ×2 IMPLANT
GOWN STRL REUS W/TWL LRG LVL3 (GOWN DISPOSABLE) ×2
NEEDLE HYPO 25X1 1.5 SAFETY (NEEDLE) IMPLANT
NS IRRIG 1000ML POUR BTL (IV SOLUTION) ×2 IMPLANT
PACK BASIN DAY SURGERY FS (CUSTOM PROCEDURE TRAY) ×2 IMPLANT
PADDING CAST ABS 4INX4YD NS (CAST SUPPLIES) ×1
PADDING CAST ABS COTTON 4X4 ST (CAST SUPPLIES) ×1 IMPLANT
PENCIL BUTTON HOLSTER BLD 10FT (ELECTRODE) ×2 IMPLANT
RUBBERBAND STERILE (MISCELLANEOUS) IMPLANT
SCREW ACUTRAK 2 MINI 16MM (Screw) ×2 IMPLANT
SKELETAL DYNAMICS DVR SET (Set) ×2 IMPLANT
SLEEVE SCD COMPRESS KNEE MED (MISCELLANEOUS) ×2 IMPLANT
SPLINT PLASTER CAST XFAST 3X15 (CAST SUPPLIES) ×9 IMPLANT
SPLINT PLASTER XTRA FASTSET 3X (CAST SUPPLIES) ×9
STOCKINETTE 4X48 STRL (DRAPES) ×2 IMPLANT
SUCTION FRAZIER TIP 10 FR DISP (SUCTIONS) ×2 IMPLANT
SUT VIC AB 2-0 PS2 27 (SUTURE) ×2 IMPLANT
SUT VICRYL 4-0 PS2 18IN ABS (SUTURE) ×2 IMPLANT
SUT VICRYL RAPIDE 4-0 (SUTURE) IMPLANT
SUT VICRYL RAPIDE 4/0 PS 2 (SUTURE) ×2 IMPLANT
SYR BULB 3OZ (MISCELLANEOUS) ×2 IMPLANT
SYRINGE 10CC LL (SYRINGE) IMPLANT
TOWEL OR 17X24 6PK STRL BLUE (TOWEL DISPOSABLE) ×2 IMPLANT
TOWEL OR NON WOVEN STRL DISP B (DISPOSABLE) IMPLANT
TUBE CONNECTING 20X1/4 (TUBING) ×2 IMPLANT
UNDERPAD 30X30 INCONTINENT (UNDERPADS AND DIAPERS) ×2 IMPLANT
WIRE FIX 1.5 STANDARD TIP (WIRE) ×2
WIRE FIX 1.5 STD TIP (WIRE) ×1 IMPLANT

## 2013-09-23 NOTE — Anesthesia Postprocedure Evaluation (Signed)
  Anesthesia Post-op Note  Patient: Kendra Fuentes  Procedure(s) Performed: Procedure(s): OPEN TREATMENT OF LEFT DISTAL RADIUS AND SCAPHOID FRACTURES (Left)  Patient Location: PACU  Anesthesia Type:GA combined with regional for post-op pain  Level of Consciousness: awake, alert  and oriented  Airway and Oxygen Therapy: Patient Spontanous Breathing  Post-op Pain: mild  Post-op Assessment: Post-op Vital signs reviewed  Post-op Vital Signs: Reviewed  Last Vitals:  Filed Vitals:   09/23/13 1100  BP: 145/50  Pulse: 73  Temp:   Resp: 9    Complications: No apparent anesthesia complications

## 2013-09-23 NOTE — Op Note (Addendum)
09/23/2013  7:46 AM  PATIENT:  Kendra Fuentes  63 y.o. female  PRE-OPERATIVE DIAGNOSIS:  Comminuted displaced left intra-articular distal radius fracture and left scaphoid fracture  POST-OPERATIVE DIAGNOSIS:  Same  PROCEDURE:  ORIF left intra-articular comminuted distal radius fracture, 57846, and ORIF left scaphoid fracture  SURGEON: Rayvon Char. Grandville Silos, MD  PHYSICIAN ASSISTANT: None  ANESTHESIA:  regional and general  SPECIMENS:  None  DRAINS:   None  PREOPERATIVE INDICATIONS:  Kendra Fuentes is a  63 y.o. female with fractures of the left distal radius and scaphoid following a fall.  The risks benefits and alternatives were discussed with the patient preoperatively including but not limited to the risks of infection, bleeding, nerve injury, cardiopulmonary complications, the need for revision surgery, among others, and the patient verbalized understanding and consented to proceed.  OPERATIVE IMPLANTS: Skeletal dynamics small plate and screws to the distal radius and mini Acutrak screw for the scaphoid  OPERATIVE PROCEDURE: After receiving prophylactic antibiotics and a regional block, the patient was escorted to the operative theatre and placed in a supine position. General anesthesia was administered.  A surgical "time-out" was performed during which the planned procedure, proposed operative site, and the correct patient identity were compared to the operative consent and agreement confirmed by the circulating nurse according to current facility policy. Following application of a tourniquet to the operative extremity, the exposed skin was pre-scrub with Hibiclens scrub brush and then was prepped with Chloraprep and draped in the usual sterile fashion. The limb was exsanguinated with an Esmarch bandage and the tourniquet inflated to approximately 135mmHg higher than systolic BP.   A sinusoidal-shaped incision was marked and made over the FCR axis and the distal forearm. The skin was incised  sharply with scalpel, subcutaneous tissues with blunt and spreading dissection. The FCR axis was exploited deeply. The pronator quadratus was reflected in an L-shaped ulnarly and the brachioradialis was split in a Z-plasty fashion for later reapproximation. There was a transverse rent in the pronator, rendering it into 2 flaps, one proximal and distal. The fracture was inspected and provisionally reduced a provisional K wire was placed from radial to ulnar, securing 2 major distal fragments.  This was confirmed fluoroscopically. The appropriately sized plate was selected and found to fit well. It was placed in its provisional alignment of the radius and this was confirmed fluoroscopically.  It was secured to the radius with a screw through the slotted hole.  A K wire was placed through the guide into the far radial plate holds help secure reduction provisionally. Additional adjustments were made as necessary, and the distal holes were all drilled and filled.  Peg/screw length distally was selected on the shorter side of measurements to minimize the risk for dorsal cortical penetration. The remainder of the proximal holes were drilled and filled.   Final images were obtained and the DRUJ was examined for stability. It was found to be sufficiently stable.   Attention was then directed to the scaphoid. A half centimeter incision was made just proximal to the scapholunate interval with spreading dissection down the capsule. The guidewire for the mini Acutrak screw was then placed into the scaphoid through this approach. Its proper placement was guided and confirmed fluoroscopically. Satisfied with the guidewire placement, the correct screw length was measured and the guidewire was driven out distally so that the wire protruding from the body both proximally and distally. The hole for the screw was then drilled and confirmed fluoroscopically and a 16  ohmmeter mini Acutrak screw placed. The guidewire was removed. Final  images were obtained. The screw was seen to be completely within the scaphoid.  The wounds were then copiously irrigated and the brachioradialis repaired with 2-0 Vicryl Rapide suture followed by repair of the pronator quadratus with the same suture type. Tourniquet was released and additional hemostasis obtained and the skin was closed with 2-0 Vicryl deep dermal buried sutures followed by running 4-0 Vicryl Rapide horizontal mattress suture in the skin. A small dorsal incision was closed with just a couple of horizontal mattress sutures of 4-0 Vicryl Rapide. A bulky dressing with a volar plaster component was applied and she was taken to room stable condition.  DISPOSITION: The patient will be discharged home today with typical post-op instructions, returning in 10-15 days for reevaluation with new x-rays of the affected wrist out of the splint to include an inclined lateral and scaphoid view, and then transition to therapy to have a custom splint constructed and begin rehabilitation.

## 2013-09-23 NOTE — Progress Notes (Signed)
  Assisted Dr. Crews with left, ultrasound guided, supraclavicular block. Side rails up, monitors on throughout procedure. See vital signs in flow sheet. Tolerated Procedure well. 

## 2013-09-23 NOTE — Anesthesia Preprocedure Evaluation (Addendum)
Anesthesia Evaluation  Patient identified by MRN, date of birth, ID band Patient awake    Reviewed: Allergy & Precautions, NPO status   Airway Mallampati: I TM Distance: >3 FB Neck ROM: Full    Dental  (+) Teeth Intact, Dental Advisory Given   Pulmonary  breath sounds clear to auscultation        Cardiovascular Rhythm:Regular Rate:Normal     Neuro/Psych Anxiety Depression    GI/Hepatic GERD-  Medicated and Controlled,  Endo/Other  Hyperthyroidism Morbid obesity  Renal/GU      Musculoskeletal   Abdominal   Peds  Hematology   Anesthesia Other Findings   Reproductive/Obstetrics                          Anesthesia Physical Anesthesia Plan  ASA: II  Anesthesia Plan: General   Post-op Pain Management:    Induction: Intravenous  Airway Management Planned:   Additional Equipment:   Intra-op Plan:   Post-operative Plan: Extubation in OR  Informed Consent: I have reviewed the patients History and Physical, chart, labs and discussed the procedure including the risks, benefits and alternatives for the proposed anesthesia with the patient or authorized representative who has indicated his/her understanding and acceptance.   Dental advisory given  Plan Discussed with: CRNA, Anesthesiologist and Surgeon  Anesthesia Plan Comments:         Anesthesia Quick Evaluation

## 2013-09-23 NOTE — Anesthesia Procedure Notes (Addendum)
Anesthesia Regional Block:  Supraclavicular block  Pre-Anesthetic Checklist: ,, timeout performed, Correct Patient, Correct Site, Correct Laterality, Correct Procedure, Correct Position, site marked, Risks and benefits discussed,  Surgical consent,  Pre-op evaluation,  At surgeon's request and post-op pain management  Laterality: Left and Upper  Prep: chloraprep       Needles:  Injection technique: Single-shot  Needle Type: Echogenic Stimulator Needle     Needle Length: 5cm 5 cm Needle Gauge: 21 and 21 G    Additional Needles:  Procedures: ultrasound guided (picture in chart) Supraclavicular block Narrative:  Start time: 09/23/2013 7:10 AM End time: 09/23/2013 7:16 AM Injection made incrementally with aspirations every 5 mL.  Performed by: Personally  Anesthesiologist: Lorrene Reid   Procedure Name: LMA Insertion Date/Time: 09/23/2013 7:51 AM Performed by: Melanye Hiraldo Pre-anesthesia Checklist: Patient identified, Emergency Drugs available, Suction available and Patient being monitored Patient Re-evaluated:Patient Re-evaluated prior to inductionOxygen Delivery Method: Circle System Utilized Preoxygenation: Pre-oxygenation with 100% oxygen Intubation Type: IV induction Ventilation: Mask ventilation without difficulty LMA: LMA inserted LMA Size: 4.0 Number of attempts: 1 Airway Equipment and Method: bite block Placement Confirmation: positive ETCO2 Tube secured with: Tape Dental Injury: Teeth and Oropharynx as per pre-operative assessment

## 2013-09-23 NOTE — Interval H&P Note (Signed)
History and Physical Interval Note:  09/23/2013 7:44 AM  Kendra Fuentes  has presented today for surgery, with the diagnosis of LEFT DISTAL RADIUS AND SCAPHOID FRACTURES   The various methods of treatment have been discussed with the patient and family. After consideration of risks, benefits and other options for treatment, the patient has consented to  Procedure(s): OPEN TREATMENT OF LEFT DISTAL RADIUS AND SCAPHOID FRACTURES (Left) as a surgical intervention .  The patient's history has been reviewed, patient examined, no change in status, stable for surgery.  I have reviewed the patient's chart and labs.  Questions were answered to the patient's satisfaction.     Jolyn Nap

## 2013-09-23 NOTE — Transfer of Care (Signed)
Immediate Anesthesia Transfer of Care Note  Patient: Kendra Fuentes  Procedure(s) Performed: Procedure(s): OPEN TREATMENT OF LEFT DISTAL RADIUS AND SCAPHOID FRACTURES (Left)  Patient Location: PACU  Anesthesia Type:GA combined with regional for post-op pain  Level of Consciousness: awake, alert , oriented and patient cooperative  Airway & Oxygen Therapy: Patient Spontanous Breathing and Patient connected to face mask oxygen  Post-op Assessment: Report given to PACU RN and Post -op Vital signs reviewed and stable  Post vital signs: Reviewed and stable  Complications: No apparent anesthesia complications

## 2013-09-23 NOTE — Discharge Instructions (Signed)
Discharge Instructions ° ° °You have a dressing with a plaster splint incorporated in it. °Move your fingers as much as possible, making a full fist and fully opening the fist. °Elevate your hand to reduce pain & swelling of the digits.  Ice over the operative site may be helpful to reduce pain & swelling.  DO NOT USE HEAT. °Pain medicine has been prescribed for you.  °Use your medicine as needed over the first 48 hours, and then you can begin to taper your use.  You may use Tylenol in place of your prescribed pain medication, but not IN ADDITION to it. °Leave the dressing in place until you return to our office.  °You may shower, but keep the bandage clean & dry.  °You may drive a car when you are off of prescription pain medications and can safely control your vehicle with both hands. °Our office will call you to arrange follow-up ° ° °Please call [phone] during normal business hours or [phone] after hours for any problems. Including the following: ° °- excessive redness of the incisions °- drainage for more than 4 days °- fever of more than 101.5 F ° °*Please note that pain medications will not be refilled after hours or on weekends. ° ° °Regional Anesthesia Blocks ° °1. Numbness or the inability to move the "blocked" extremity may last from 3-48 hours after placement. The length of time depends on the medication injected and your individual response to the medication. If the numbness is not going away after 48 hours, call your surgeon. ° °2. The extremity that is blocked will need to be protected until the numbness is gone and the  Strength has returned. Because you cannot feel it, you will need to take extra care to avoid injury. Because it may be weak, you may have difficulty moving it or using it. You may not know what position it is in without looking at it while the block is in effect. ° °3. For blocks in the legs and feet, returning to weight bearing and walking needs to be done carefully. You  will need to wait until the numbness is entirely gone and the strength has returned. You should be able to move your leg and foot normally before you try and bear weight or walk. You will need someone to be with you when you first try to ensure you do not fall and possibly risk injury. ° °4. Bruising and tenderness at the needle site are common side effects and will resolve in a few days. ° °5. Persistent numbness or new problems with movement should be communicated to the surgeon or the [facility] ([phone])/ [facility] ([phone]). ° °Post Anesthesia Home Care Instructions ° °Activity: °Get plenty of rest for the remainder of the day. A responsible adult should stay with you for 24 hours following the procedure.  °For the next 24 hours, DO NOT: °-Drive a car °-Operate machinery °-Drink alcoholic beverages °-Take any medication unless instructed by your physician °-Make any legal decisions or sign important papers. ° °Meals: °Start with liquid foods such as gelatin or soup. Progress to regular foods as tolerated. Avoid greasy, spicy, heavy foods. If nausea and/or vomiting occur, drink only clear liquids until the nausea and/or vomiting subsides. Call your physician if vomiting continues. ° °Special Instructions/Symptoms: °Your throat may feel dry or sore from the anesthesia or the breathing tube placed in your throat during surgery. If this causes discomfort, gargle with warm salt water.   discomfort should disappear within 24 hours.

## 2013-09-25 ENCOUNTER — Encounter (HOSPITAL_BASED_OUTPATIENT_CLINIC_OR_DEPARTMENT_OTHER): Payer: Self-pay | Admitting: Orthopedic Surgery

## 2014-09-20 IMAGING — CR DG SCAPULA*L*
2 series · 2 of 2 positions shown · non-contrast
Comparison: None.

CLINICAL DATA: Left shoulder pain

EXAM:
LEFT SCAPULA - 2+ VIEWS

[w scapula ap left]
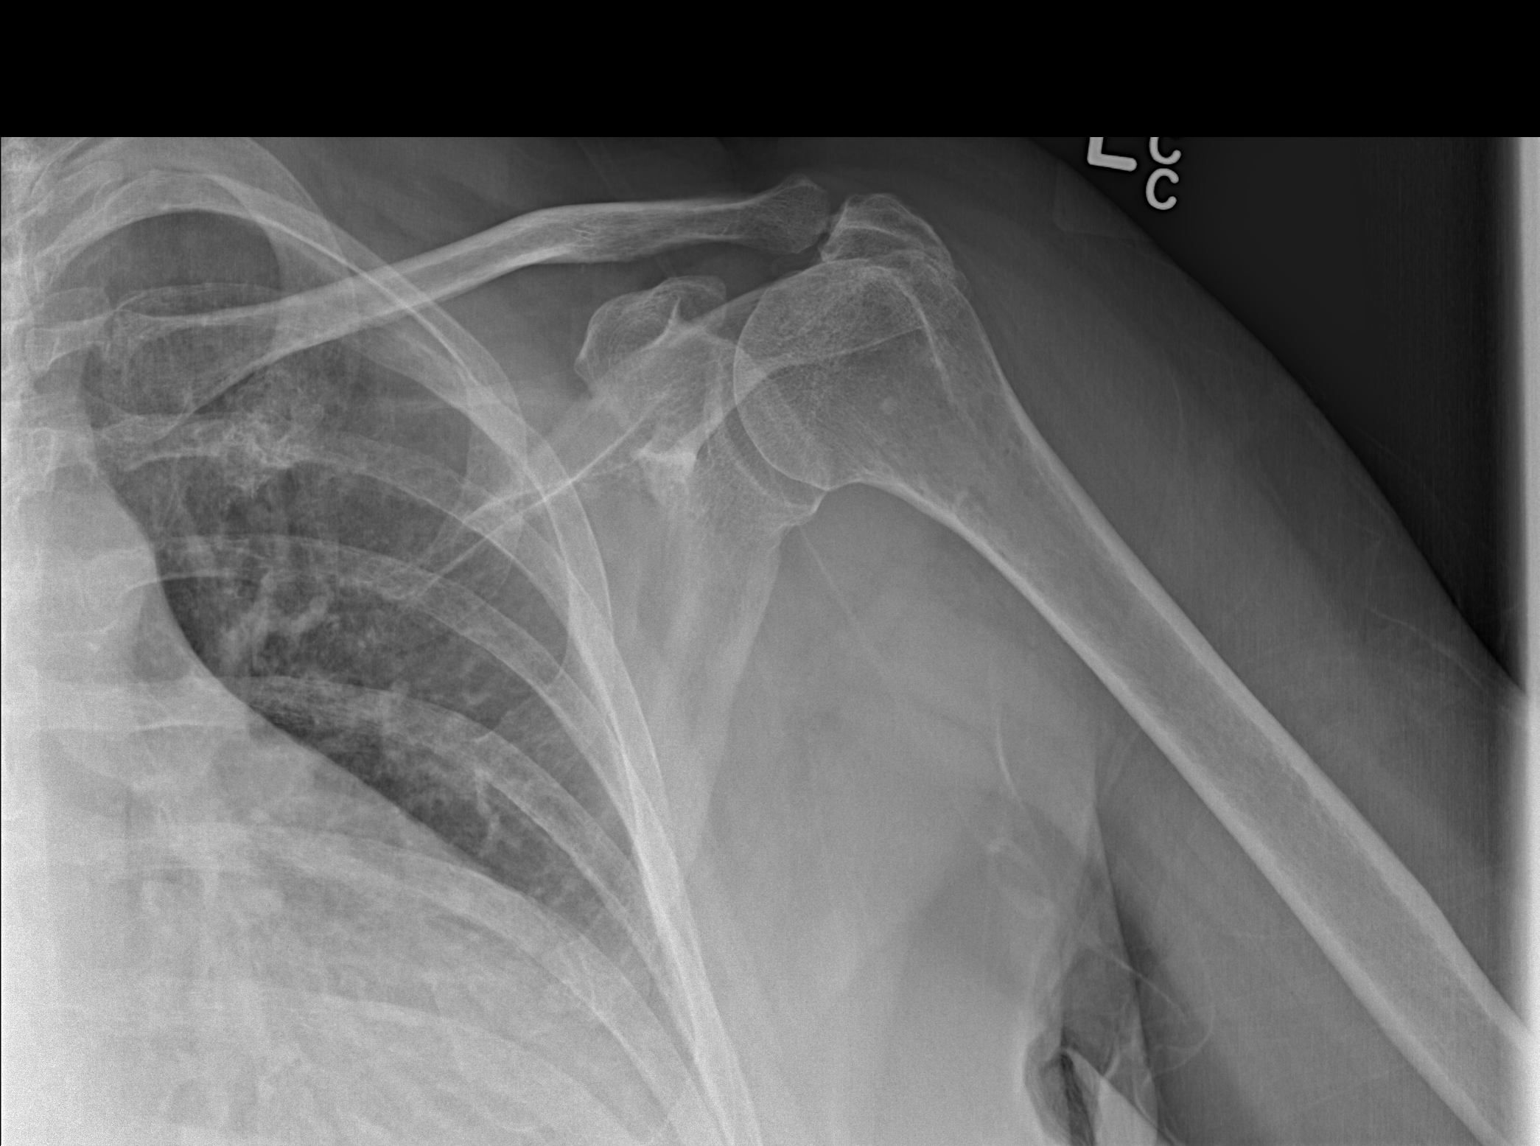

[w scapula y-view left]
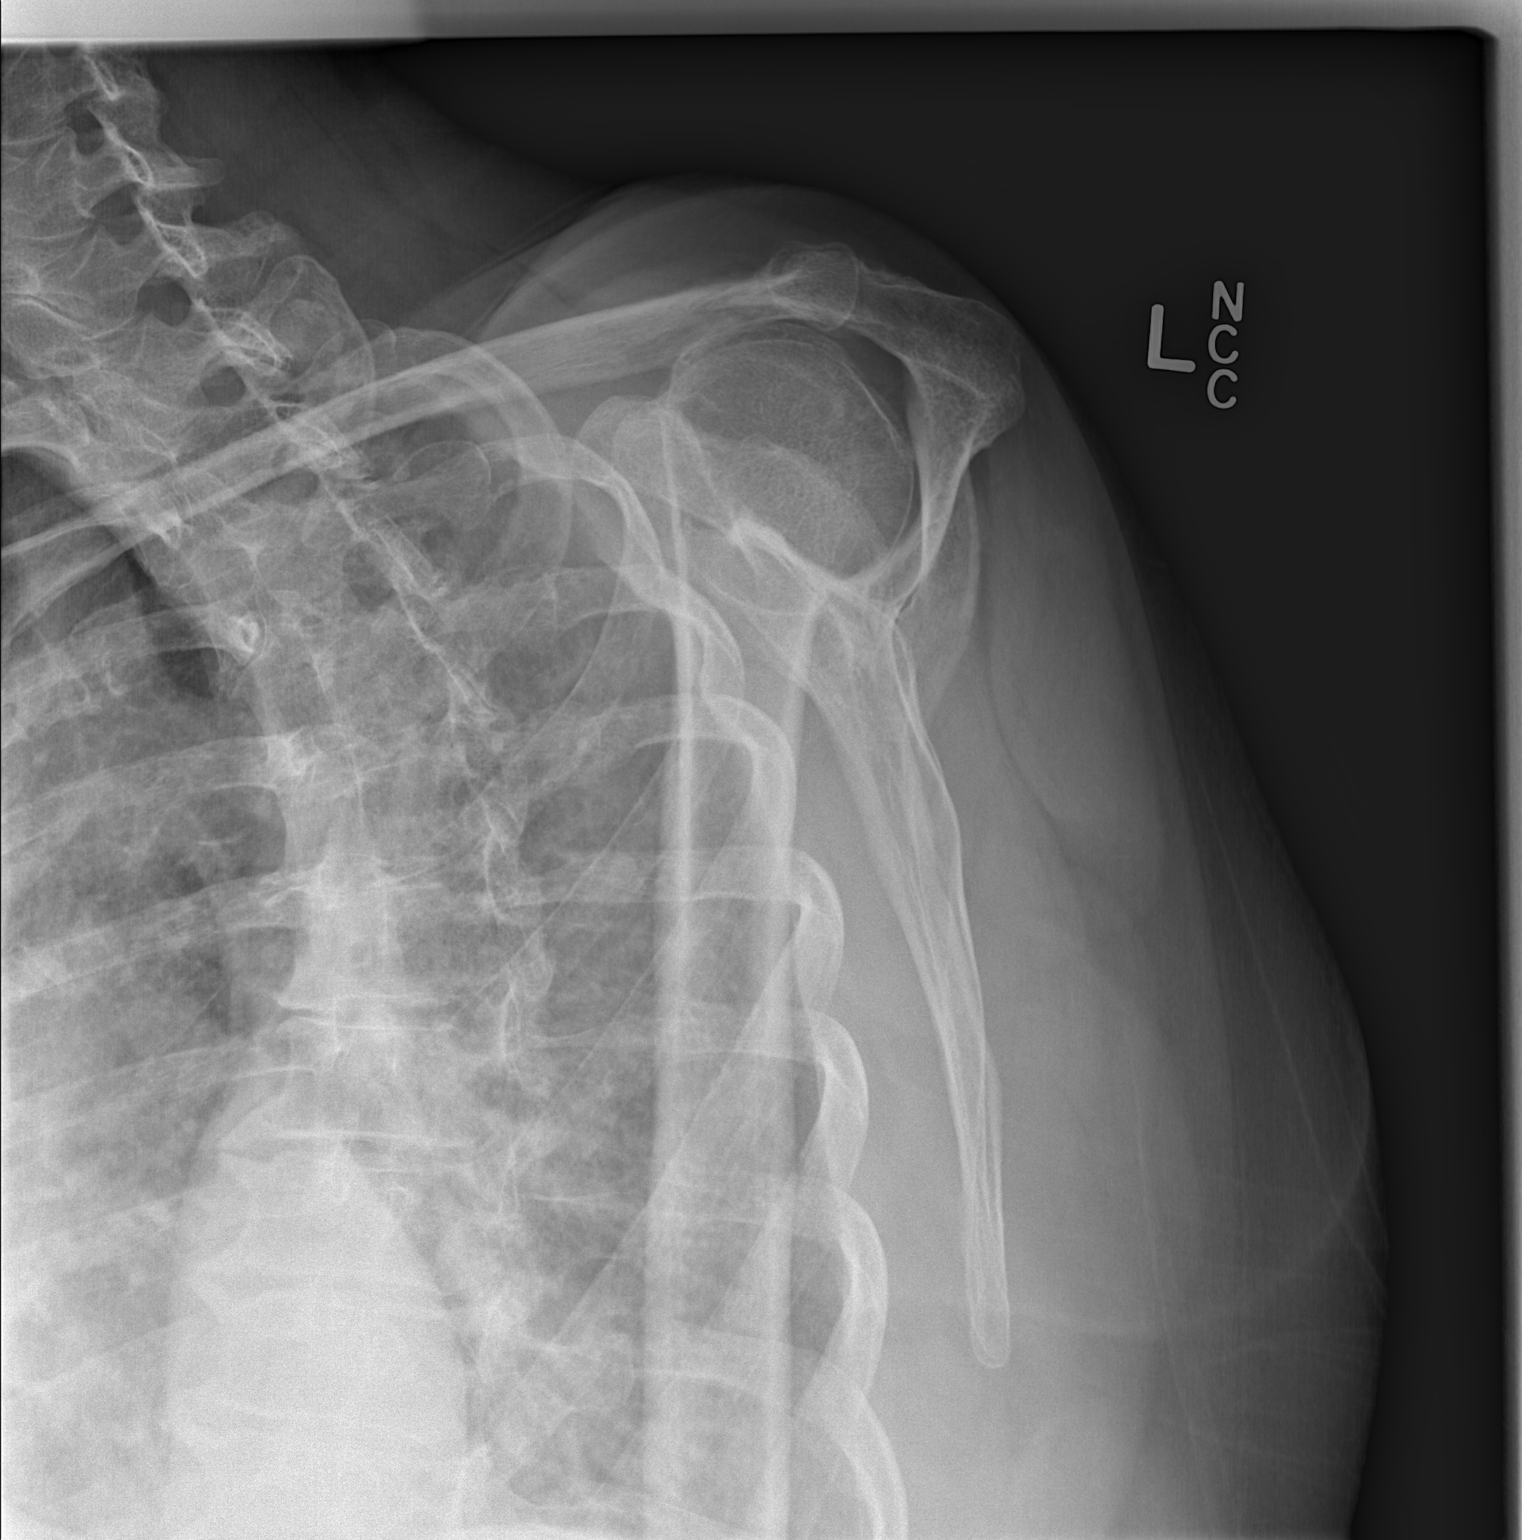

[2 of 2 positions shown; findings below may reference images not displayed]

FINDINGS: There is no evidence of fracture or other focal bone lesions. Soft
tissues are unremarkable.
IMPRESSION: No acute abnormality noted.

## 2015-01-30 HISTORY — PX: ROBOTIC ASSISTED SUPRACERVICAL HYSTERECTOMY WITH BILATERAL SALPINGO OOPHERECTOMY: SHX6084

## 2015-01-30 HISTORY — PX: LAPAROSCOPIC PELVIC LYMPH NODE BIOPSY: SHX5914

## 2015-03-30 ENCOUNTER — Encounter: Payer: Self-pay | Admitting: Radiation Oncology

## 2015-03-30 NOTE — Progress Notes (Signed)
GYN Location of Tumor / Histology: grade 2 endometroid adenocarcinoma   Kendra Fuentes presented with 2-3 months of vaginal spotting, and was subsequently referred to Serina Cowper at [place].   Biopsies revealed:   01/30/15  Past/Anticipated interventions by Gyn/Onc surgery, if any: 01/30/15 - robotic hysterectomy with BSO, and Bilateral pelvic lymph node dissection by Dr. Polly Cobia   Past/Anticipated interventions by medical oncology, if any: none  Weight changes, if any: no  Bowel/Bladder complaints, if any: reports urinary frequency that she has always had.  She denies having any bowel issues.  Nausea/Vomiting, if any: reports she vomits when she coughs.  She has occasional nausea.  Pain issues, if any:  Yes - reports generalized bone pain since surgery.  Her right ankle feels like it is "in a vise" and is worse at night.   SAFETY ISSUES:  Prior radiation? Had radiation to her thyroid 30 years ago at [facility]  Pacemaker/ICD? no  Possible current pregnancy? no  Is the patient on methotrexate? No  Current Complaints / other details:  Referral for vaginal cuff brachytherapy.  She reports her sister also had endometrial cancer.  She denies having any vaginal/rectal bleeding or discharge.  BP 125/63 mmHg  Pulse 65  Temp(Src) 98.4 F (36.9 C) (Oral)  Resp 18  Ht 5\' 2"  (1.575 m)  Wt 210 lb 6.4 oz (95.437 kg)  BMI 38.47 kg/m2  SpO2 97%

## 2015-04-08 ENCOUNTER — Ambulatory Visit
Admission: RE | Admit: 2015-04-08 | Discharge: 2015-04-08 | Disposition: A | Discharge status: Home / Self Care | Payer: BLUE CROSS/BLUE SHIELD | Source: Ambulatory Visit | Attending: Radiation Oncology | Admitting: Radiation Oncology

## 2015-04-08 ENCOUNTER — Encounter: Payer: Self-pay | Admitting: Radiation Oncology

## 2015-04-08 ENCOUNTER — Telehealth: Payer: Self-pay | Admitting: *Deleted

## 2015-04-08 VITALS — BP 125/63 | HR 65 | Temp 98.4°F | Resp 18 | Ht 62.0 in | Wt 210.4 lb

## 2015-04-08 DIAGNOSIS — C541 Malignant neoplasm of endometrium: Secondary | ICD-10-CM | POA: Insufficient documentation

## 2015-04-08 DIAGNOSIS — F419 Anxiety disorder, unspecified: Secondary | ICD-10-CM | POA: Diagnosis not present

## 2015-04-08 DIAGNOSIS — Z510 Encounter for antineoplastic radiation therapy: Secondary | ICD-10-CM | POA: Diagnosis not present

## 2015-04-08 DIAGNOSIS — Z90710 Acquired absence of both cervix and uterus: Secondary | ICD-10-CM | POA: Insufficient documentation

## 2015-04-08 DIAGNOSIS — F329 Major depressive disorder, single episode, unspecified: Secondary | ICD-10-CM | POA: Insufficient documentation

## 2015-04-08 DIAGNOSIS — Z8639 Personal history of other endocrine, nutritional and metabolic disease: Secondary | ICD-10-CM | POA: Diagnosis not present

## 2015-04-08 DIAGNOSIS — Z923 Personal history of irradiation: Secondary | ICD-10-CM | POA: Insufficient documentation

## 2015-04-08 DIAGNOSIS — Z808 Family history of malignant neoplasm of other organs or systems: Secondary | ICD-10-CM | POA: Diagnosis not present

## 2015-04-08 HISTORY — DX: Malignant neoplasm of endometrium: C54.1

## 2015-04-08 NOTE — Progress Notes (Signed)
Radiation Oncology         [phone] ________________________________  Initial Outpatient Consultation  Name: Kendra Fuentes MRN: [id number]  Date: 04/08/2015  DOB: [birth year]  [id number], Danton Sewer, NT  Skinner, [place] Bailiff, MD   REFERRING PHYSICIAN: [name], MD  DIAGNOSIS: The encounter diagnosis was Endometrial cancer [facility]). Stage IB grade 2 endometroid adenocarcinoma   HISTORY OF PRESENT ILLNESS::Kendra Fuentes is a 64 y.o. female who is seen out courtesy of Dr. Polly Cobia for an opinion concerning radiation therapy as part of management of patient's recently diagnosed endometrial cancer.  The patient presented with vaginal spotting this past summer and was referred to gynecology ( Dr.Skinner). The patient underwent robotic hysterectomy with BSO, and Bilateral pelvic lymph node dissection on 01/30/15. Final pathology revealed a grade 2 endometrioid adenocarcinoma measuring 3.8 cm.  The depth of invasion is 2.4 out of 2.6 cm. No LSVI was noted. No spread into the pelvic nodal regions. On today's visit, the patient states she feels well after surgery. Only complains of right foot pain. 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  She also works in [employer], arts as a second job.  01/30/15 Pathology      PREVIOUS RADIATION THERAPY: Yes, Had radiation to her thyroid 30 years ago at [facility] for Graves disease  PAST MEDICAL HISTORY:  has a past medical history of Graves disease; Anxiety; Depression; GERD (gastroesophageal reflux disease); Arthritis; and Endometrial cancer ([place]).    PAST SURGICAL HISTORY: Past Surgical History  Procedure Laterality Date  . Cholecystectomy    . Knee surgery Left   . Open reduction internal fixation (orif) distal radial fracture Left 09/23/2013    Procedure: OPEN TREATMENT OF LEFT DISTAL RADIUS AND SCAPHOID FRACTURES;  Surgeon: Jolyn Nap, MD;  Location: [place];  Service: Orthopedics;  Laterality: Left;  . Robotic  assisted supracervical hysterectomy with bilateral salpingo oopherectomy  01/30/15    by Dr. Polly Cobia at [facility]  . Laparoscopic pelvic lymph node biopsy  01/30/15    by Dr. Polly Cobia at [place]: family history includes Endometrial cancer in her sister.  SOCIAL HISTORY:  reports that she has never smoked. She does not have any smokeless tobacco history on file. She reports that she does not drink alcohol or use illicit drugs.  ALLERGIES: Codeine and Penicillins  MEDICATIONS:  Current Outpatient Prescriptions  Medication Sig Dispense Refill  . citalopram (CELEXA) 20 MG tablet Take 20 mg by mouth daily.    Marland Kitchen ibuprofen (ADVIL,MOTRIN) 800 MG tablet Take 1 tablet (800 mg total) by mouth 3 (three) times daily. 21 tablet 0  . levothyroxine (SYNTHROID, LEVOTHROID) 150 MCG tablet Take 150 mcg by mouth daily before breakfast.    . Cholecalciferol (VITAMIN D3) 2000 UNITS capsule Take 2,000 Units by mouth.    . ondansetron (ZOFRAN ODT) 4 MG disintegrating tablet Take 1 tablet (4 mg total) by mouth every 4 (four) hours as needed for nausea or vomiting. (Patient not taking: Reported on 04/08/2015) 20 tablet 0  . oxyCODONE-acetaminophen (PERCOCET) 5-325 MG per tablet Take 1-2 tablets by mouth every 4 (four) hours as needed for severe pain. (Patient not taking: Reported on 04/08/2015) 80 tablet 0   No current facility-administered medications for this encounter.    REVIEW OF SYSTEMS:  A 15 point review of systems is documented in the electronic medical record. This was obtained by the nursing staff. However, I reviewed this with the patient to  discuss relevant findings and make appropriate changes.  Pertinent items are noted in HPI.  no vaginal bleeding or discharge since her surgery. The patient denies any urinary symptoms or bowel complaints. His appetite is good. She has minimal fatigue at this time.   PHYSICAL EXAM:  height is 5\' 2"  (1.575 m) and weight is 210 lb 6.4 oz (95.437  kg). Her oral temperature is 98.4 F (36.9 C). Her blood pressure is 125/63 and her pulse is 65. Her respiration is 18 and oxygen saturation is 97%.   General: Well-developed, in no acute distress HEENT: Normocephalic, atraumatic, no palpable supraclavicular or axillary adenopathy Cardiovascular: Regular rate and rhythm Respiratory: Clear to auscultation bilaterally GI: Soft, nontender, normal bowel sounds, 5 small scars from laparoscopic procedure, well-healed no signs of drainage or infection Extremities: Some swelling noted in the right ankle region. The patient has significant varicose veins in both lower extremities.   Pelvic: No inguinal adenopathy,  pelvic exam deferred until planning day.   ECOG = 1  LABORATORY DATA:  Lab Results  Component Value Date   WBC 11.2* 01/21/2013   HGB 12.3 09/23/2013   HCT 36.6 01/21/2013   MCV 94.1 01/21/2013   PLT 257 01/21/2013   NEUTROABS 9.3* 01/21/2013   Lab Results  Component Value Date   NA 137 01/21/2013   K 4.1 01/21/2013   CL 102 01/21/2013   CO2 27 01/21/2013   GLUCOSE 140* 01/21/2013   CREATININE 1.08 01/21/2013   CALCIUM 9.9 01/21/2013     RADIOGRAPHY: No results found.    IMPRESSION:  Stage IB grade 2 endometroid adenocarcinoma. The patient was found to have a deeply invasive tumor and would be considered at high intermediate risk for recurrence  I agree with Dr. Shelba Flake recommendation for vaginal brachytherapy.   PLAN: We discussed the possible side effects and risks of treatment in addition to the possible benefits of treatment. We discussed the protocol for radiation treatment.  All of the patient's questions were answered. The patient does wish to proceed with this treatment and has signed a consent form. A simulation will be scheduled such that we can proceed with treatment planning.  Follow up with Dr. Polly Cobia in 4 months.       Patient will return next week for planning and her first high-dose-rate treatment.  Anticipate 5 brachytherapy treatments directed at the proximal vagina.  Marland Kitchen   ------------------------------------------------  Blair Promise, PhD, MD   This document serves as a record of services personally performed by Gery Pray, MD. It was created on his behalf by Derek Mound, a trained medical scribe. The creation of this record is based on the scribe's personal observations and the provider's statements to them. This document has been checked and approved by the attending provider.

## 2015-04-08 NOTE — Telephone Encounter (Signed)
Called patient to inform of New HDR Vag. Cuff Case, lvm for a return call 

## 2015-04-08 NOTE — Progress Notes (Signed)
Please see the Nurse Progress Note in the MD Initial Consult Encounter for this patient. 

## 2015-04-15 ENCOUNTER — Telehealth: Payer: Self-pay | Admitting: *Deleted

## 2015-04-15 NOTE — Telephone Encounter (Signed)
CALLED PATIENT TO REMIND OF APPTS. FOR 04-16-15, LVM FOR A RETURN CALL

## 2015-04-16 ENCOUNTER — Ambulatory Visit
Admission: RE | Admit: 2015-04-16 | Discharge: 2015-04-16 | Disposition: A | Discharge status: Home / Self Care | Payer: BLUE CROSS/BLUE SHIELD | Source: Ambulatory Visit | Attending: Radiation Oncology | Admitting: Radiation Oncology

## 2015-04-16 ENCOUNTER — Encounter: Payer: Self-pay | Admitting: Radiation Oncology

## 2015-04-16 VITALS — BP 129/63 | HR 56 | Temp 98.4°F | Resp 18 | Ht 62.0 in | Wt 210.4 lb

## 2015-04-16 DIAGNOSIS — C541 Malignant neoplasm of endometrium: Secondary | ICD-10-CM

## 2015-04-16 DIAGNOSIS — Z510 Encounter for antineoplastic radiation therapy: Secondary | ICD-10-CM | POA: Diagnosis not present

## 2015-04-16 NOTE — Progress Notes (Signed)
Please see the Nurse Progress Note in the MD Initial Consult Encounter for this patient. 

## 2015-04-16 NOTE — Progress Notes (Addendum)
  Radiation Oncology         (336) [phone] ________________________________  Name: Kendra Fuentes MRN: [id number]  Date: 04/16/2015  DOB: 10/08/1950  HDR BRACHYTHERAPY NOTE  DIAGNOSIS: Stage IB grade 2 endometroid adenocarcinoma   Simple treatment device note  Patient had construction of her custom vaginal cylinder. She will be treated with a 3.0 cm diameter segmented cylinder. This conforms to her anatomy without undue discomfort.   NARRATIVE: The patient was brought to the HDR suite. Identity was confirmed. All relevant records and images related to the planned course of therapy were reviewed. The patient freely provided informed written consent to proceed with treatment after reviewing the details related to the planned course of therapy. The consent form was witnessed and verified by the simulation staff. Then, the patient was set-up in a stable reproducible supine position for radiation therapy. The patient's custom vaginal cylinder was placed in the proximal vagina. This was affixed to the CT/MR stabilization plate to prevent slippage. Patient tolerated the placement well.   Verification simulation note:  A fiducial marker was placed within the vaginal cylinder. An AP and lateral film was then obtained through the pelvis area. This documented accurate position of the vaginal cylinder for treatment.   HDR BRACHYTHERAPY TREATMENT   The remote afterloading device was affixed to the vaginal cylinder by catheter. Patient then proceeded to undergo her first high-dose-rate treatment directed at the proximal vagina. The patient was prescribed a dose of 6 gray to be delivered to the mucosal surface. Treatment length was 4 cm. Prescription was 6 gray to the mucosal surface. Patient was treated with 1 channel using 9 dwell positions. Treatment time was 356.6 seconds. Iridium 192 was the high-dose-rate source for treatment. The patient tolerated the treatment well after completion of her therapy. A  radiation survey was performed documenting return of the iridium source into the GammaMed safe.  PLAN: She will return next week for her second and third HDR brachytherapy treatments.  ________________________________  Blair Promise, PhD, MD  This document serves as a record of services personally performed by Gery Pray, MD. It was created on his behalf by Darcus Austin, a trained medical scribe. The creation of this record is based on the scribe's personal observations and the provider's statements to them. This document has been checked and approved by the attending provider.

## 2015-04-16 NOTE — Progress Notes (Signed)
  Radiation Oncology         [phone] ________________________________  Name: MACAYLE CACKOWSKI MRN: [id number]  Date: 04/16/2015  DOB: 25-Apr-1951  SIMULATION AND TREATMENT PLANNING NOTE HDR BRACHYTHERAPY  DIAGNOSIS: Stage IB grade 2 endometroid adenocarcinoma  NARRATIVE:  The patient was brought to the [place] suite.  Identity was confirmed.  All relevant records and images related to the planned course of therapy were reviewed.  The patient freely provided informed written consent to proceed with treatment after reviewing the details related to the planned course of therapy. The consent form was witnessed and verified by the simulation staff.  Then, the patient was set-up in a stable reproducible  supine position for radiation therapy.  CT images were obtained.  Surface markings were placed.  The CT images were loaded into the planning software.  Then the target and avoidance structures were contoured.  Treatment planning then occurred.  The radiation prescription was entered and confirmed.   I have requested : Brachytherapy Isodose Plan and Dosimetry Calculations to plan the radiation distribution.  PLAN:  The patient will receive 30 Gy in 5 fraction. The patient will be treated with a 3 cm diameter segmented cylinder. Treatment length will be 4 cm. Prescription will be to the vaginal mucosal surface. Iridium 192 will be the high-dose-rate source. ________________________________  Blair Promise, PhD, MD  This document serves as a record of services personally performed by Gery Pray, MD. It was created on his behalf by Darcus Austin, a trained medical scribe. The creation of this record is based on the scribe's personal observations and the provider's statements to them. This document has been checked and approved by the attending provider.

## 2015-04-16 NOTE — Progress Notes (Signed)
Kendra Fuentes here for follow up new.  She continues to report pain in her right ankle.  She denies having any bowel or bladder issues except some occasional discomfort/pressure when sneezing or coughing in her pelvic area.  She denise having any vaginal/rectal bleeding or discharge.  BP 129/63 mmHg  Pulse 56  Temp(Src) 98.4 F (36.9 C) (Oral)  Resp 18  Ht 5\' 2"  (1.575 m)  Wt 210 lb 6.4 oz (95.437 kg)  BMI 38.47 kg/m2

## 2015-04-16 NOTE — Progress Notes (Signed)
  Radiation Oncology         [phone] ________________________________  Name: Kendra Fuentes MRN: [id number]  Date: 04/16/2015  DOB: 1950/11/27  Vaginal Brachytherapy Procedure Note  CC: Frann Rider, NT  Polly Cobia [place] Bailiff, MD   Diagnosis:   Stage IB grade 2 endometroid adenocarcinoma   Narrative:  The patient returns today for planning and her first brachytherapy treatment. She continues to report pain in her right ankle. It hurts when she walks. She denies having any bowel or bladder issues, except some occasional discomfort/pressure in her pelvic area when she sneezes or coughs. She denies any vaginal/rectal bleeding or discharge. She reports urinary urgency, but denies cloudy urine or signs of infection.  ALLERGIES:  is allergic to codeine and penicillins.  Meds: Current Outpatient Prescriptions  Medication Sig Dispense Refill  . Cholecalciferol (VITAMIN D3) 2000 UNITS capsule Take 2,000 Units by mouth.    . citalopram (CELEXA) 20 MG tablet Take 20 mg by mouth daily.    Marland Kitchen levothyroxine (SYNTHROID, LEVOTHROID) 150 MCG tablet Take 150 mcg by mouth daily before breakfast.    . ibuprofen (ADVIL,MOTRIN) 800 MG tablet Take 1 tablet (800 mg total) by mouth 3 (three) times daily. (Patient not taking: Reported on 04/16/2015) 21 tablet 0  . ondansetron (ZOFRAN ODT) 4 MG disintegrating tablet Take 1 tablet (4 mg total) by mouth every 4 (four) hours as needed for nausea or vomiting. (Patient not taking: Reported on 04/08/2015) 20 tablet 0  . oxyCODONE-acetaminophen (PERCOCET) 5-325 MG per tablet Take 1-2 tablets by mouth every 4 (four) hours as needed for severe pain. (Patient not taking: Reported on 04/08/2015) 80 tablet 0   No current facility-administered medications for this encounter.    Physical Findings: The patient is in no acute distress. Patient is alert and oriented.  height is 5\' 2"  (1.575 m) and weight is 210 lb 6.4 oz (95.437 kg). Her oral temperature is 98.4 F (36.9  C). Her blood pressure is 129/63 and her pulse is 56. Her respiration is 18.   Lungs are clear to auscultation bilaterally. Heart has regular rate and rhythm. No palpable cervical, supraclavicular, or axillary adenopathy. Abdomen soft non-tender with normal bowel sounds.  On pelvic examination the external genitalia were unremarkable. A speculum exam was performed. There are no mucosal lesions noted in the vaginal vault. Vaginal cuff well healed on bimanual exam. The patient proceeded to undergo the fitting of a vaginal cylinder. Optimal diameter to distend the vaginal vault without undue discomfort was a 3.0 cm diameter segmented cylinder.  Lab Findings: Lab Results  Component Value Date   WBC 11.2* 01/21/2013   HGB 12.3 09/23/2013   HCT 36.6 01/21/2013   MCV 94.1 01/21/2013   PLT 257 01/21/2013     Impression: Stage IB grade 2 endometroid adenocarcinoma. Successful fitting for vaginal brachytherapy.  Plan: She will undergo CT simulation this morning and HDR treatment later this afternoon.  -----------------------------------  Blair Promise, PhD, MD  This document serves as a record of services personally performed by Gery Pray, MD. It was created on his behalf by Darcus Austin, a trained medical scribe. The creation of this record is based on the scribe's personal observations and the provider's statements to them. This document has been checked and approved by the attending provider.

## 2015-04-20 ENCOUNTER — Telehealth: Payer: Self-pay | Admitting: *Deleted

## 2015-04-20 NOTE — Telephone Encounter (Signed)
CALLED PATIENT TO REMIND OF HDR TX. FOR 04-21-15 @ 9 AM, LVM FOR A RETURN CALL

## 2015-04-21 ENCOUNTER — Ambulatory Visit
Admission: RE | Admit: 2015-04-21 | Discharge: 2015-04-21 | Disposition: A | Discharge status: Home / Self Care | Payer: BLUE CROSS/BLUE SHIELD | Source: Ambulatory Visit | Attending: Radiation Oncology | Admitting: Radiation Oncology

## 2015-04-21 DIAGNOSIS — Z510 Encounter for antineoplastic radiation therapy: Secondary | ICD-10-CM | POA: Diagnosis not present

## 2015-04-21 DIAGNOSIS — C541 Malignant neoplasm of endometrium: Secondary | ICD-10-CM

## 2015-04-21 NOTE — Progress Notes (Signed)
  Radiation Oncology (336) [phone] ________________________________  Name: Kendra Fuentes: [id number] Date: 12/13/2016DOB: 06/30/1950  HDR BRACHYTHERAPY NOTE  DIAGNOSIS: Stage IB grade 2 endometroid adenocarcinoma   Simple treatment device note  Patient had construction of her custom vaginal cylinder. She will be treated with a 3.0 cm diameter segmented cylinder. This conforms to her anatomy without undue discomfort.   Vaginal brachytherapy procedure: The patient was brought to the [place] suite. Identity was confirmed. All relevant records and images related to the planned course of therapy were reviewed. The patient freely provided informed written consent to proceed with treatment after reviewing the details related to the planned course of therapy. The consent form was witnessed and verified by the simulation staff. Then, the patient was set-up in a stable reproducible supine position for radiation therapy. A pelvic exam was performed revealing the vaginal cuff to be intact. No pelvic masses appreciated. The patient's custom vaginal cylinder was placed in the proximal vagina. This was affixed to the CT/MR stabilization plate to prevent slippage. Patient tolerated the placement well.   Verification simulation note:  A fiducial marker was placed within the vaginal cylinder. An AP and lateral film was then obtained through the pelvis area. This documented accurate position of the vaginal cylinder for treatment.   HDR BRACHYTHERAPY TREATMENT   The remote afterloading device was affixed to the vaginal cylinder by catheter. Patient then proceeded to undergo her second high-dose-rate treatment directed at the proximal vagina. The patient was prescribed a dose of 6 gray to be delivered to the mucosal surface. Treatment length was 4 cm. Prescription was 6 gray to the mucosal surface. Patient was treated with 1 channel using 9 dwell positions. Treatment time  was 373.8 seconds. Iridium 192 was the high-dose-rate source for treatment. The patient tolerated the treatment well after completion of her therapy. A radiation survey was performed documenting return of the iridium source into the GammaMed safe.  PLAN: She will return later this week for her  third HDR brachytherapy treatment.  ________________________________  Blair Promise, PhD, MD

## 2015-04-22 ENCOUNTER — Telehealth: Payer: Self-pay | Admitting: *Deleted

## 2015-04-22 NOTE — Telephone Encounter (Signed)
Called patient to remind of HDR Tx. For 04-23-15 @ 9 am, lvm for a return call

## 2015-04-23 ENCOUNTER — Ambulatory Visit
Admission: RE | Admit: 2015-04-23 | Discharge: 2015-04-23 | Disposition: A | Discharge status: Home / Self Care | Payer: BLUE CROSS/BLUE SHIELD | Source: Ambulatory Visit | Attending: Radiation Oncology | Admitting: Radiation Oncology

## 2015-04-23 DIAGNOSIS — Z510 Encounter for antineoplastic radiation therapy: Secondary | ICD-10-CM | POA: Diagnosis not present

## 2015-04-23 DIAGNOSIS — C541 Malignant neoplasm of endometrium: Secondary | ICD-10-CM

## 2015-04-23 NOTE — Progress Notes (Signed)
  Radiation Oncology         [phone] ________________________________  Name: Kendra Fuentes MRN: [id number]  Date: 04/23/2015  DOB: July 21, 1950    CC: Kendra Fuentes, NT  [name], MD  HDR BRACHYTHERAPY NOTE  DIAGNOSIS: Stage IB grade 2 endometroid adenocarcinoma    Simple treatment device note: Patient had construction of her custom vaginal cylinder. She will be treated with a 3.0 cm diameter segmented cylinder. This conforms to her anatomy without undue discomfort.  Vaginal brachytherapy procedure: The patient was brought to the [place] suite. Identity was confirmed. All relevant records and images related to the planned course of therapy were reviewed. The patient freely provided informed written consent to proceed with treatment after reviewing the details related to the planned course of therapy. The consent form was witnessed and verified by the simulation staff. Then, the patient was set-up in a stable reproducible supine position for radiation therapy. The patient proceeded to undergo pelvic examination. No masses detected. The vaginal cuff was noted to be intact .The patient's custom vaginal cylinder was placed in the proximal vagina. This was affixed to the CT/MR stabilization plate to prevent slippage. Patient tolerated the placement well.  Verification simulation note:  A fiducial marker was placed within the vaginal cylinder. An AP and lateral film was then obtained through the pelvis area. This documented accurate position of the vaginal cylinder for treatment.  HDR BRACHYTHERAPY TREATMENT  The remote afterloading device was affixed to the vaginal cylinder by catheter. Patient then proceeded to undergo her third high-dose-rate treatment directed at the proximal vagina. The patient was prescribed a dose of 6 gray to be delivered to the mucosal surface. Treatment length was 4 cm. Prescription was 6 gray to the mucosal surface. Patient was treated with 1 channel using 9  dwell positions. Treatment time was 6 minutes 20  seconds. Iridium 192 was the high-dose-rate source for treatment. The patient tolerated the treatment well.  After completion of her therapy,  a radiation survey was performed documenting return of the iridium source into the GammaMed safe.  PLAN: She will return next week for her fourth HDR brachytherapy treatment. ________________________________  Blair Promise, PhD, MD     This document serves as a record of services personally performed by Gery Pray, MD. It was created on his behalf by Lendon Collar, a trained medical scribe. The creation of this record is based on the scribe's personal observations and the provider's statements to them. This document has been checked and approved by the attending provider.

## 2015-04-27 ENCOUNTER — Telehealth: Payer: Self-pay | Admitting: *Deleted

## 2015-04-27 NOTE — Telephone Encounter (Signed)
XXXX 

## 2015-04-27 NOTE — Telephone Encounter (Signed)
CALLED PATIENT TO REMIND OF HDR TX. FOR 04-28-15 @ 9 AM, LVM FOR A RETURN CALL

## 2015-04-28 ENCOUNTER — Ambulatory Visit
Admission: RE | Admit: 2015-04-28 | Discharge: 2015-04-28 | Disposition: A | Discharge status: Home / Self Care | Payer: BLUE CROSS/BLUE SHIELD | Source: Ambulatory Visit | Attending: Radiation Oncology | Admitting: Radiation Oncology

## 2015-04-28 DIAGNOSIS — Z510 Encounter for antineoplastic radiation therapy: Secondary | ICD-10-CM | POA: Diagnosis not present

## 2015-04-28 DIAGNOSIS — C541 Malignant neoplasm of endometrium: Secondary | ICD-10-CM

## 2015-04-28 MED ORDER — FLUCONAZOLE 150 MG PO TABS: 150.0000 mg | ORAL_TABLET | Freq: Every day | ORAL | Status: DC

## 2015-04-28 NOTE — Progress Notes (Signed)
Expand All Collapse All    Radiation Oncology [phone] ________________________________  Name: Kendra Fuentes: [id number] Date: 12/20/2016DOB: December 16, 1950    CC: Frann Rider, NT [name], MD  HDR BRACHYTHERAPY NOTE  DIAGNOSIS: Stage IB grade 2 endometroid adenocarcinoma   Simple treatment device note: Patient had construction of her custom vaginal cylinder. She will be treated with a 3.0 cm diameter segmented cylinder. This conforms to her anatomy without undue discomfort.  Vaginal brachytherapy procedure: The patient was brought to the [place] suite. Identity was confirmed. All relevant records and images related to the planned course of therapy were reviewed. The patient freely provided informed written consent to proceed with treatment after reviewing the details related to the planned course of therapy. The consent form was witnessed and verified by the simulation staff. Then, the patient was set-up in a stable reproducible supine position for radiation therapy. The patient proceeded to undergo pelvic examination. No masses detected. The vaginal cuff was noted to be intact .The patient's custom vaginal cylinder was placed in the proximal vagina. This was affixed to the CT/MR stabilization plate to prevent slippage. Patient tolerated the placement well.  Verification simulation note:  A fiducial marker was placed within the vaginal cylinder. An AP and lateral film was then obtained through the pelvis area. This documented accurate position of the vaginal cylinder for treatment.  HDR BRACHYTHERAPY TREATMENT  The remote afterloading device was affixed to the vaginal cylinder by catheter. Patient then proceeded to undergo her fourth high-dose-rate treatment directed at the proximal vagina. The patient was prescribed a dose of 6 gray to be delivered to the mucosal surface. Treatment length was 4 cm. Prescription was 6 gray to  the mucosal surface. Patient was treated with 1 channel using 9 dwell positions. Treatment time was 399 seconds. Iridium 192 was the high-dose-rate source for treatment. The patient tolerated the treatment well. After completion of her therapy, a radiation survey was performed documenting return of the iridium source into the GammaMed safe.  PLAN: She will return later this week for her fifth HDR brachytherapy treatment. ________________________________  Blair Promise, PhD, MD

## 2015-04-30 ENCOUNTER — Encounter: Payer: Self-pay | Admitting: Radiation Oncology

## 2015-04-30 ENCOUNTER — Ambulatory Visit
Admission: RE | Admit: 2015-04-30 | Discharge: 2015-04-30 | Disposition: A | Discharge status: Home / Self Care | Payer: BLUE CROSS/BLUE SHIELD | Source: Ambulatory Visit | Attending: Radiation Oncology | Admitting: Radiation Oncology

## 2015-04-30 DIAGNOSIS — C541 Malignant neoplasm of endometrium: Secondary | ICD-10-CM

## 2015-04-30 DIAGNOSIS — Z510 Encounter for antineoplastic radiation therapy: Secondary | ICD-10-CM | POA: Diagnosis not present

## 2015-04-30 NOTE — Progress Notes (Signed)
  Radiation Oncology         [phone] ________________________________  Name: Kendra Fuentes MRN: [id number]  Date: 04/30/2015  DOB: 01-02-1951    CC: Frann Rider, NT  [name], MD  HDR BRACHYTHERAPY NOTE  DIAGNOSIS: Stage IB grade 2 endometroid adenocarcinoma   Simple treatment device note:  Patient had construction of her custom vaginal cylinder. She will be treated with a 3 cm diameter segmented cylinder. This conforms to her anatomy without undue discomfort.  NARRATIVE: The patient was brought to the HDR suite. Identity was confirmed. All relevant records and images related to the planned course of therapy were reviewed. The patient freely provided informed written consent to proceed with treatment after reviewing the details related to the planned course of therapy. The consent form was witnessed and verified by the simulation staff. Then, the patient was set-up in a stable reproducible supine position for radiation therapy. The patient's custom vaginal cylinder was placed in the proximal vagina. This was affixed to the CT/MR stabilization plate to prevent slippage. Patient tolerated the placement well.  Verification simulation note:   A fiducial marker was placed within the vaginal cylinder. An AP and lateral film was then obtained through the pelvis area. This documented accurate position of the vaginal cylinder for treatment.  HDR BRACHYTHERAPY TREATMENT   The remote afterloading device was affixed to the vaginal cylinder by catheter. Patient then proceeded to undergo her fifth high-dose-rate treatment directed at the proximal vagina. The patient was prescribed a dose of 6 gray to be delivered to the mucosal surface. Treatment length was 4 cm. Prescription was 6 gray to the mucosal surface. Patient was treated with 1 channel using 9 dwell positions. Treatment time was 406.5 seconds. Iridium 192 was the high-dose-rate source for treatment. The patient tolerated the  treatment well.  After completion of her therapy a radiation survey was performed documenting return of the iridium source into the [employer] safe.   PLAN: She has completed treatment and will follow up between 4-6 weeks. If she has any questions or concerns, she knows to call. ________________________________  Blair Promise, PhD, MD   This document serves as a record of services personally performed by Gery Pray, MD. It was created on his behalf by Darcus Austin, a trained medical scribe. The creation of this record is based on the scribe's personal observations and the provider's statements to them. This document has been checked and approved by the attending provider.

## 2015-05-07 NOTE — Progress Notes (Signed)
  Radiation Oncology         [phone] ________________________________  Name: Kendra Fuentes MRN: [id number]  Date: 04/30/2015  DOB: 01/14/1951  End of Treatment Note  DIAGNOSIS: Stage IB grade 2 endometroid adenocarcinoma  Indication for treatment:  Postop, risk for vaginal cuff recurrence2      Radiation treatment dates:   04/16/2015, 04/21/2015, 04/23/2015, 04/28/2015, 04/30/2015  Site/dose:   Vaginal cuff, 30 gray in 5 fractions  Beams/energy:   The patient was treated with a 3 cm diameter segmented cylinder. Prescription was to the mucosal surface. A 4 cm treatment length was used. . Iridium 192 was the high-dose-rate source.  Narrative: The patient tolerated radiation treatment relatively well.   She had minimal discomfort in the vulvovaginal area during the course of treatment.  Plan: The patient has completed radiation treatment. The patient will return to radiation oncology clinic for routine followup in one month. I advised them to call or return sooner if they have any questions or concerns related to their recovery or treatment.  -----------------------------------  Blair Promise, PhD, MD

## 2015-05-18 ENCOUNTER — Encounter: Payer: Self-pay | Admitting: Radiation Oncology

## 2015-06-18 ENCOUNTER — Ambulatory Visit
Admission: RE | Admit: 2015-06-18 | Discharge: 2015-06-18 | Disposition: A | Discharge status: Home / Self Care | Payer: BLUE CROSS/BLUE SHIELD | Source: Ambulatory Visit | Attending: Radiation Oncology | Admitting: Radiation Oncology

## 2015-06-18 ENCOUNTER — Encounter: Payer: Self-pay | Admitting: Oncology

## 2015-06-18 VITALS — BP 136/79 | HR 56 | Temp 97.7°F | Ht 62.0 in | Wt 195.5 lb

## 2015-06-18 DIAGNOSIS — C541 Malignant neoplasm of endometrium: Secondary | ICD-10-CM

## 2015-06-18 HISTORY — DX: Reserved for concepts with insufficient information to code with codable children: IMO0002

## 2015-06-18 HISTORY — DX: Reserved for inherently not codable concepts without codable children: IMO0001

## 2015-06-18 NOTE — Progress Notes (Signed)
Kendra Fuentes is here for follow up of 5 fraction of radiation last received on 04/30/15 to her Vaginal Cuff. She denies pain, vaginal discharge. She also denies urinary or bowel issues at this time. She has intentionally lost 20 lbs over the last month to improve her health. She has been given a vaginal dilator and instructed on its use today, and she voiced her understanding.   BP 136/79 mmHg  Pulse 56  Temp(Src) 97.7 F (36.5 C)  Ht 5\' 2"  (1.575 m)  Wt 195 lb 8 oz (88.678 kg)  BMI 35.75 kg/m2   Wt Readings from Last 3 Encounters:  06/18/15 195 lb 8 oz (88.678 kg)  04/16/15 210 lb 6.4 oz (95.437 kg)  04/08/15 210 lb 6.4 oz (95.437 kg)

## 2015-06-18 NOTE — Progress Notes (Signed)
  Radiation Oncology         [phone] ________________________________  Name: Kendra Fuentes MRN: [id number]  Date: 06/18/2015  DOB: 03-May-1951  Follow-Up Visit Note  CC: Frann Rider, NT  Polly Cobia [place] Bailiff, MD   Diagnosis:   Stage IB grade 2 endometroid adenocarcinoma.  Interval Since Last Radiation: 6 weeks. Completed radiation 04/16/2015, 04/21/2015, 04/23/2015, 04/28/2015, 04/30/2015 to the vaginal cuff with 30 Gy in 5 fractions.  Narrative:  The patient returns today for routine follow-up. She denies pain, vaginal discharge, and vaginal bleeding. She also denies urinary or bowel issues at this time. She has intentionally lost 20 lbs over the last month to improve her health. She has been given a vaginal dilator and instructed on its use today, and she voiced her understanding.  ALLERGIES:  is allergic to codeine and penicillins.  Meds: Current Outpatient Prescriptions  Medication Sig Dispense Refill  . citalopram (CELEXA) 20 MG tablet Take 20 mg by mouth daily.    Marland Kitchen levothyroxine (SYNTHROID, LEVOTHROID) 150 MCG tablet Take 150 mcg by mouth daily before breakfast.    . ibuprofen (ADVIL,MOTRIN) 800 MG tablet Take 1 tablet (800 mg total) by mouth 3 (three) times daily. (Patient not taking: Reported on 04/16/2015) 21 tablet 0  . ondansetron (ZOFRAN ODT) 4 MG disintegrating tablet Take 1 tablet (4 mg total) by mouth every 4 (four) hours as needed for nausea or vomiting. (Patient not taking: Reported on 04/08/2015) 20 tablet 0  . oxyCODONE-acetaminophen (PERCOCET) 5-325 MG per tablet Take 1-2 tablets by mouth every 4 (four) hours as needed for severe pain. (Patient not taking: Reported on 04/08/2015) 80 tablet 0   No current facility-administered medications for this encounter.    Physical Findings: The patient is in no acute distress. Patient is alert and oriented.  height is 5\' 2"  (1.575 m) and weight is 195 lb 8 oz (88.678 kg). Her temperature is 97.7 F (36.5 C). Her blood  pressure is 136/79 and her pulse is 56. Marland Kitchen   General: Alert and oriented, in no acute distress Neck: Neck is supple, no palpable cervical or supraclavicular lymphadenopathy. Heart: Regular in rate and rhythm with no murmurs, rubs, or gallops. Chest: Clear to auscultation bilaterally, with no rhonchi, wheezes, or rales. Lymphatics: see Neck Exam Psychiatric: Judgment and insight are intact. Affect is appropriate.  Lab Findings: Lab Results  Component Value Date   WBC 11.2* 01/21/2013   HGB 12.3 09/23/2013   HCT 36.6 01/21/2013   MCV 94.1 01/21/2013   PLT 257 01/21/2013    Impression:  The patient is doing well at this time without any pelvic complaints. Congratulated her on her weight loss.  Plan:  She will make an appointment with Dr. Polly Cobia for March. She will follow up with me in June 2017.  ____________________________________ -----------------------------------  Blair Promise, PhD, MD    This document serves as a record of services personally performed by Gery Pray, MD. It was created on his behalf by Lendon Collar, a trained medical scribe. The creation of this record is based on the scribe's personal observations and the provider's statements to them. This document has been checked and approved by the attending provider.

## 2015-07-07 ENCOUNTER — Telehealth: Payer: Self-pay | Admitting: *Deleted

## 2015-07-07 NOTE — Telephone Encounter (Signed)
On 07-07-15 fax medical records to [facility] it was consult note, follow up notes, end of tx note, sim & planning note, tx plan

## 2015-12-03 ENCOUNTER — Other Ambulatory Visit: Payer: Self-pay | Admitting: Orthopedic Surgery

## 2015-12-07 ENCOUNTER — Encounter (HOSPITAL_COMMUNITY): Payer: Self-pay

## 2015-12-07 NOTE — Pre-Procedure Instructions (Signed)
Kendra Fuentes  12/07/2015      [facility] #[id number] - [place], Kendra Fuentes [place] [address] Phone: [phone] Fax: [phone]    Your procedure is scheduled on Monday, Aug 7  Report to [facility] Admitting at 1:00 pm  Call this number if you have problems the morning of surgery:  [phone]   Remember:  Do not eat food or drink liquids after midnight on Aug. 6   Take these medicines the morning of surgery with A SIP OF WATER: citalopram (celexa) ,levothyroxine(synthroid), hydrocodone-acetaminophen (Norco)              STOP taking these medicines starting today: any Aspirin, Aleve, Naproxen, Ibuprofen, Motrin, Advil, Goody's, BC's, all herbal medications, fish oil, and all vitamins    Do not wear jewelry, make-up or nail polish.  Do not wear lotions, powders, or perfumes.  You may NOT wear deoderant.  Do not shave 48 hours prior to surgery.    Do not bring valuables to the [facility].  [facility] is not responsible for any belongings or valuables.  Contacts, dentures or bridgework may not be worn into surgery.  Leave your suitcase in the car.  After surgery it may be brought to your room.  For patients admitted to the [facility], discharge time will be determined by your treatment team.  Patients discharged the day of surgery will not be allowed to drive home.    Special instructions:     [name]- Preparing For Surgery  Before surgery, you can play an important role. Because skin is not sterile, your skin needs to be as free of germs as possible. You can reduce the number of germs on your skin by washing with CHG (chlorahexidine gluconate) Soap before surgery.  CHG is an antiseptic cleaner which kills germs and bonds with the skin to continue killing germs even after washing.  Please do not use if you have an allergy to CHG or antibacterial soaps. If your skin becomes reddened/irritated stop using the CHG.   Do not shave (including legs and underarms) for at least 48 hours prior to first CHG shower. It is OK to shave your face.  Please follow these instructions carefully.   1. Shower the NIGHT BEFORE SURGERY and the MORNING OF SURGERY with CHG.   2. If you chose to wash your hair, wash your hair first as usual with your normal shampoo.  3. After you shampoo, rinse your hair and body thoroughly to remove the shampoo.  4. Use CHG as you would any other liquid soap. You can apply CHG directly to the skin and wash gently with a scrungie or a clean washcloth.   5. Apply the CHG Soap to your body ONLY FROM THE NECK DOWN.  Do not use on open wounds or open sores. Avoid contact with your eyes, ears, mouth and genitals (private parts). Wash genitals (private parts) with your normal soap.  6. Wash thoroughly, paying special attention to the area where your surgery will be performed.  7. Thoroughly rinse your body with warm water from the neck down.  8. DO NOT shower/wash with your normal soap after using and rinsing off the CHG Soap.  9. Pat yourself dry with a CLEAN TOWEL.   10. Wear CLEAN PAJAMAS   11. Place CLEAN SHEETS on your bed the night of your first shower and DO NOT SLEEP WITH PETS.    Day of Surgery: Do  not apply any deodorants/lotions. Please wear clean clothes to the [facility]/surgery center.      Please read over the following fact sheets that you were given. Coughing and Deep Breathing, MRSA Information and Surgical Site Infection Prevention

## 2015-12-08 ENCOUNTER — Encounter (HOSPITAL_COMMUNITY)
Admission: RE | Admit: 2015-12-08 | Discharge: 2015-12-08 | Disposition: A | Discharge status: Home / Self Care | Payer: BLUE CROSS/BLUE SHIELD | Source: Ambulatory Visit | Attending: Orthopedic Surgery | Admitting: Orthopedic Surgery

## 2015-12-08 ENCOUNTER — Ambulatory Visit (HOSPITAL_COMMUNITY)
Admission: RE | Admit: 2015-12-08 | Discharge: 2015-12-08 | Disposition: A | Discharge status: Home / Self Care | Payer: BLUE CROSS/BLUE SHIELD | Source: Ambulatory Visit | Attending: Orthopedic Surgery | Admitting: Orthopedic Surgery

## 2015-12-08 ENCOUNTER — Encounter (HOSPITAL_COMMUNITY): Payer: Self-pay

## 2015-12-08 DIAGNOSIS — R001 Bradycardia, unspecified: Secondary | ICD-10-CM | POA: Insufficient documentation

## 2015-12-08 DIAGNOSIS — Z01810 Encounter for preprocedural cardiovascular examination: Secondary | ICD-10-CM | POA: Insufficient documentation

## 2015-12-08 DIAGNOSIS — Z01818 Encounter for other preprocedural examination: Secondary | ICD-10-CM

## 2015-12-08 HISTORY — DX: Dizziness and giddiness: R42

## 2015-12-08 HISTORY — DX: Urgency of urination: R39.15

## 2015-12-08 HISTORY — DX: Prediabetes: R73.03

## 2015-12-08 HISTORY — DX: Other specified soft tissue disorders: M79.89

## 2015-12-08 HISTORY — DX: Cardiac murmur, unspecified: R01.1

## 2015-12-08 LAB — CBC WITH DIFFERENTIAL/PLATELET
BASOS ABS: 0 10*3/uL (ref 0.0–0.1)
BASOS PCT: 0 %
EOS ABS: 0.4 10*3/uL (ref 0.0–0.7)
Eosinophils Relative: 8 %
HCT: 37.2 % (ref 36.0–46.0)
HEMOGLOBIN: 11.5 g/dL — AB (ref 12.0–15.0)
LYMPHS ABS: 0.9 10*3/uL (ref 0.7–4.0)
Lymphocytes Relative: 19 %
MCH: 30.9 pg (ref 26.0–34.0)
MCHC: 30.9 g/dL (ref 30.0–36.0)
MCV: 100 fL (ref 78.0–100.0)
Monocytes Absolute: 0.5 10*3/uL (ref 0.1–1.0)
Monocytes Relative: 10 %
NEUTROS PCT: 63 %
Neutro Abs: 2.9 10*3/uL (ref 1.7–7.7)
Platelets: 281 10*3/uL (ref 150–400)
RBC: 3.72 MIL/uL — AB (ref 3.87–5.11)
RDW: 13.9 % (ref 11.5–15.5)
WBC: 4.7 10*3/uL (ref 4.0–10.5)

## 2015-12-08 LAB — URINALYSIS, ROUTINE W REFLEX MICROSCOPIC
Bilirubin Urine: NEGATIVE
GLUCOSE, UA: NEGATIVE mg/dL
HGB URINE DIPSTICK: NEGATIVE
Ketones, ur: NEGATIVE mg/dL
Nitrite: NEGATIVE
Protein, ur: NEGATIVE mg/dL
SPECIFIC GRAVITY, URINE: 1.022 (ref 1.005–1.030)
pH: 5.5 (ref 5.0–8.0)

## 2015-12-08 LAB — COMPREHENSIVE METABOLIC PANEL
ALBUMIN: 3.7 g/dL (ref 3.5–5.0)
ALK PHOS: 271 U/L — AB (ref 38–126)
ALT: 65 U/L — AB (ref 14–54)
AST: 39 U/L (ref 15–41)
Anion gap: 5 (ref 5–15)
BUN: 20 mg/dL (ref 6–20)
CALCIUM: 9.9 mg/dL (ref 8.9–10.3)
CO2: 26 mmol/L (ref 22–32)
CREATININE: 0.91 mg/dL (ref 0.44–1.00)
Chloride: 106 mmol/L (ref 101–111)
GFR calc Af Amer: >60 mL/min (ref >60)
GFR calc non Af Amer: >60 mL/min (ref >60)
GLUCOSE: 93 mg/dL (ref 65–99)
Potassium: 4 mmol/L (ref 3.5–5.1)
SODIUM: 137 mmol/L (ref 135–145)
Total Bilirubin: 0.5 mg/dL (ref 0.3–1.2)
Total Protein: 7.3 g/dL (ref 6.5–8.1)

## 2015-12-08 LAB — SURGICAL PCR SCREEN
MRSA, PCR: NEGATIVE
Staphylococcus aureus: NEGATIVE

## 2015-12-08 LAB — URINE MICROSCOPIC-ADD ON: RBC / HPF: NONE SEEN RBC/hpf (ref 0–5)

## 2015-12-08 LAB — PROTIME-INR
INR: 0.97
Prothrombin Time: 12.8 seconds (ref 11.4–15.2)

## 2015-12-08 LAB — GLUCOSE, CAPILLARY: Glucose-Capillary: 107 mg/dL — ABNORMAL HIGH (ref 65–99)

## 2015-12-08 LAB — APTT: aPTT: 29 seconds (ref 24–36)

## 2015-12-08 NOTE — Progress Notes (Signed)
PCP: Retta Mac No cardiologist, last stress test >20 yr ago EKG: 12/08/15 CXR: 12/08/15 currently pending  Pt with no complaints of SOB, chest pain at this time.

## 2015-12-13 NOTE — Anesthesia Preprocedure Evaluation (Addendum)
Anesthesia Evaluation  Patient identified by MRN, date of birth, ID band Patient awake    Reviewed: Allergy & Precautions, NPO status , Patient's Chart, lab work & pertinent test results  History of Anesthesia Complications Negative for: history of anesthetic complications  Airway Mallampati: I  TM Distance: >3 FB Neck ROM: Full    Dental   Pulmonary neg pulmonary ROS,    Pulmonary exam normal        Cardiovascular Normal cardiovascular exam+ Valvular Problems/Murmurs      Neuro/Psych PSYCHIATRIC DISORDERS Anxiety Depression    GI/Hepatic GERD  ,  Endo/Other  diabetes (borderline, not on medications)Hypothyroidism Hyperthyroidism (h/o Grave's disease)   Renal/GU      Musculoskeletal  (+) Arthritis ,   Abdominal   Peds  Hematology   Anesthesia Other Findings H/o endometrial cancer s/p radiation 06/2014   Reproductive/Obstetrics                            Anesthesia Physical Anesthesia Plan  ASA: III  Anesthesia Plan: General   Post-op Pain Management: GA combined w/ Regional for post-op pain   Induction: Intravenous  Airway Management Planned: Oral ETT  Additional Equipment:   Intra-op Plan:   Post-operative Plan: Extubation in OR  Informed Consent: I have reviewed the patients History and Physical, chart, labs and discussed the procedure including the risks, benefits and alternatives for the proposed anesthesia with the patient or authorized representative who has indicated his/her understanding and acceptance.   Dental advisory given  Plan Discussed with: CRNA and Surgeon  Anesthesia Plan Comments:        Anesthesia Quick Evaluation

## 2015-12-14 ENCOUNTER — Inpatient Hospital Stay (HOSPITAL_COMMUNITY)
Admission: RE | Admit: 2015-12-14 | Discharge: 2015-12-15 | DRG: 483 | Disposition: A | Discharge status: Home / Self Care | Payer: BLUE CROSS/BLUE SHIELD | Source: Ambulatory Visit | Attending: Orthopedic Surgery | Admitting: Orthopedic Surgery

## 2015-12-14 ENCOUNTER — Encounter (HOSPITAL_COMMUNITY): Payer: Self-pay | Admitting: Certified Registered Nurse Anesthetist

## 2015-12-14 ENCOUNTER — Inpatient Hospital Stay (HOSPITAL_COMMUNITY): Payer: BLUE CROSS/BLUE SHIELD | Admitting: Anesthesiology

## 2015-12-14 ENCOUNTER — Encounter (HOSPITAL_COMMUNITY)
Admission: RE | Disposition: A | Discharge status: Home / Self Care | Payer: Self-pay | Source: Ambulatory Visit | Attending: Orthopedic Surgery

## 2015-12-14 ENCOUNTER — Inpatient Hospital Stay (HOSPITAL_COMMUNITY): Payer: BLUE CROSS/BLUE SHIELD

## 2015-12-14 DIAGNOSIS — W19XXXA Unspecified fall, initial encounter: Secondary | ICD-10-CM | POA: Diagnosis present

## 2015-12-14 DIAGNOSIS — I959 Hypotension, unspecified: Secondary | ICD-10-CM | POA: Diagnosis not present

## 2015-12-14 DIAGNOSIS — Z880 Allergy status to penicillin: Secondary | ICD-10-CM

## 2015-12-14 DIAGNOSIS — D62 Acute posthemorrhagic anemia: Secondary | ICD-10-CM | POA: Diagnosis not present

## 2015-12-14 DIAGNOSIS — S42209A Unspecified fracture of upper end of unspecified humerus, initial encounter for closed fracture: Principal | ICD-10-CM | POA: Diagnosis present

## 2015-12-14 DIAGNOSIS — Z885 Allergy status to narcotic agent status: Secondary | ICD-10-CM

## 2015-12-14 DIAGNOSIS — N390 Urinary tract infection, site not specified: Secondary | ICD-10-CM | POA: Diagnosis present

## 2015-12-14 DIAGNOSIS — Z79899 Other long term (current) drug therapy: Secondary | ICD-10-CM

## 2015-12-14 DIAGNOSIS — Z96619 Presence of unspecified artificial shoulder joint: Secondary | ICD-10-CM

## 2015-12-14 DIAGNOSIS — Z96611 Presence of right artificial shoulder joint: Secondary | ICD-10-CM

## 2015-12-14 HISTORY — PX: REVERSE SHOULDER ARTHROPLASTY: SHX5054

## 2015-12-14 SURGERY — ARTHROPLASTY, SHOULDER, TOTAL, REVERSE
Anesthesia: Regional | Laterality: Right

## 2015-12-14 MED ORDER — OXYCODONE HCL 5 MG PO TABS
5.0000 mg | ORAL_TABLET | ORAL | Status: DC | PRN
Start: 2015-12-14 — End: 2015-12-15
  Administered 2015-12-14 – 2015-12-15 (×2): 5 mg via ORAL
  Administered 2015-12-15: 10 mg via ORAL
  Filled 2015-12-14: qty 2
  Filled 2015-12-14: qty 1
  Filled 2015-12-14: qty 2

## 2015-12-14 MED ORDER — CIPROFLOXACIN HCL 500 MG PO TABS
500.0000 mg | ORAL_TABLET | Freq: Two times a day (BID) | ORAL | Status: DC
  Administered 2015-12-15: 500 mg via ORAL
  Filled 2015-12-14: qty 1

## 2015-12-14 MED ORDER — CLINDAMYCIN PHOSPHATE 600 MG/50ML IV SOLN
600.0000 mg | Freq: Four times a day (QID) | INTRAVENOUS | Status: DC
  Administered 2015-12-14 – 2015-12-15 (×2): 600 mg via INTRAVENOUS
  Filled 2015-12-14 (×3): qty 50

## 2015-12-14 MED ORDER — MENTHOL 3 MG MT LOZG: 1.0000 | LOZENGE | OROMUCOSAL | Status: DC | PRN

## 2015-12-14 MED ORDER — FENTANYL CITRATE (PF) 100 MCG/2ML IJ SOLN
INTRAMUSCULAR | Status: AC
  Administered 2015-12-14: 50 ug via INTRAVENOUS
  Filled 2015-12-14: qty 2

## 2015-12-14 MED ORDER — PROPOFOL 10 MG/ML IV BOLUS
INTRAVENOUS | Status: DC | PRN
  Administered 2015-12-14: 90 mg via INTRAVENOUS

## 2015-12-14 MED ORDER — ACETAMINOPHEN 325 MG PO TABS: 650.0000 mg | ORAL_TABLET | Freq: Four times a day (QID) | ORAL | Status: DC | PRN

## 2015-12-14 MED ORDER — ASPIRIN EC 325 MG PO TBEC
325.0000 mg | DELAYED_RELEASE_TABLET | Freq: Two times a day (BID) | ORAL | Status: DC
  Administered 2015-12-14 – 2015-12-15 (×2): 325 mg via ORAL
  Filled 2015-12-14 (×2): qty 1

## 2015-12-14 MED ORDER — LEVOTHYROXINE SODIUM 75 MCG PO TABS
150.0000 ug | ORAL_TABLET | Freq: Every day | ORAL | Status: DC
  Administered 2015-12-15: 150 ug via ORAL
  Filled 2015-12-14: qty 2
  Filled 2015-12-14: qty 6

## 2015-12-14 MED ORDER — ACETAMINOPHEN 650 MG RE SUPP: 650.0000 mg | Freq: Four times a day (QID) | RECTAL | Status: DC | PRN

## 2015-12-14 MED ORDER — BUPIVACAINE-EPINEPHRINE (PF) 0.5% -1:200000 IJ SOLN
INTRAMUSCULAR | Status: DC | PRN
  Administered 2015-12-14: 30 mL via PERINEURAL

## 2015-12-14 MED ORDER — BISACODYL 5 MG PO TBEC: 5.0000 mg | DELAYED_RELEASE_TABLET | Freq: Every day | ORAL | Status: DC | PRN

## 2015-12-14 MED ORDER — ACETAMINOPHEN 500 MG PO TABS
1000.0000 mg | ORAL_TABLET | Freq: Four times a day (QID) | ORAL | Status: DC
  Administered 2015-12-14 – 2015-12-15 (×3): 1000 mg via ORAL
  Filled 2015-12-14 (×3): qty 2

## 2015-12-14 MED ORDER — MORPHINE SULFATE (PF) 2 MG/ML IV SOLN: 1.0000 mg | INTRAVENOUS | Status: DC | PRN

## 2015-12-14 MED ORDER — 0.9 % SODIUM CHLORIDE (POUR BTL) OPTIME
TOPICAL | Status: DC | PRN
  Administered 2015-12-14: 1000 mL

## 2015-12-14 MED ORDER — POVIDONE-IODINE 7.5 % EX SOLN
Freq: Once | CUTANEOUS | Status: DC
  Filled 2015-12-14: qty 118

## 2015-12-14 MED ORDER — CITALOPRAM HYDROBROMIDE 20 MG PO TABS
20.0000 mg | ORAL_TABLET | Freq: Every day | ORAL | Status: DC
Start: 2015-12-15 — End: 2015-12-15
  Administered 2015-12-15: 20 mg via ORAL
  Filled 2015-12-14: qty 1

## 2015-12-14 MED ORDER — ALUM & MAG HYDROXIDE-SIMETH 200-200-20 MG/5ML PO SUSP: 30.0000 mL | ORAL | Status: DC | PRN

## 2015-12-14 MED ORDER — FENTANYL CITRATE (PF) 100 MCG/2ML IJ SOLN
INTRAMUSCULAR | Status: DC | PRN
  Administered 2015-12-14: 50 ug via INTRAVENOUS
  Administered 2015-12-14: 100 ug via INTRAVENOUS
  Administered 2015-12-14 (×2): 50 ug via INTRAVENOUS

## 2015-12-14 MED ORDER — DOCUSATE SODIUM 100 MG PO CAPS
100.0000 mg | ORAL_CAPSULE | Freq: Two times a day (BID) | ORAL | Status: DC
  Administered 2015-12-14 – 2015-12-15 (×2): 100 mg via ORAL
  Filled 2015-12-14 (×2): qty 1

## 2015-12-14 MED ORDER — VANCOMYCIN HCL IN DEXTROSE 1-5 GM/200ML-% IV SOLN
1000.0000 mg | INTRAVENOUS | Status: AC
  Administered 2015-12-14: 1000 mg via INTRAVENOUS

## 2015-12-14 MED ORDER — LACTATED RINGERS IV SOLN
INTRAVENOUS | Status: DC
  Administered 2015-12-14: 17:00:00 via INTRAVENOUS
  Administered 2015-12-14: 50 mL/h via INTRAVENOUS
  Administered 2015-12-14: 14:00:00 via INTRAVENOUS

## 2015-12-14 MED ORDER — VANCOMYCIN HCL IN DEXTROSE 1-5 GM/200ML-% IV SOLN
INTRAVENOUS | Status: AC
  Filled 2015-12-14: qty 200

## 2015-12-14 MED ORDER — FLEET ENEMA 7-19 GM/118ML RE ENEM: 1.0000 | ENEMA | Freq: Once | RECTAL | Status: DC | PRN

## 2015-12-14 MED ORDER — POLYETHYLENE GLYCOL 3350 17 G PO PACK: 17.0000 g | PACK | Freq: Every day | ORAL | Status: DC | PRN

## 2015-12-14 MED ORDER — MIDAZOLAM HCL 5 MG/5ML IJ SOLN
INTRAMUSCULAR | Status: DC | PRN
  Administered 2015-12-14: 2 mg via INTRAVENOUS

## 2015-12-14 MED ORDER — ONDANSETRON HCL 4 MG PO TABS: 4.0000 mg | ORAL_TABLET | Freq: Four times a day (QID) | ORAL | Status: DC | PRN

## 2015-12-14 MED ORDER — PROPOFOL 10 MG/ML IV BOLUS
INTRAVENOUS | Status: AC
  Filled 2015-12-14: qty 20

## 2015-12-14 MED ORDER — BUPIVACAINE-EPINEPHRINE (PF) 0.25% -1:200000 IJ SOLN
INTRAMUSCULAR | Status: AC
  Filled 2015-12-14: qty 30

## 2015-12-14 MED ORDER — ONDANSETRON HCL 4 MG/2ML IJ SOLN
INTRAMUSCULAR | Status: DC | PRN
Start: 2015-12-14 — End: 2015-12-14
  Administered 2015-12-14: 4 mg via INTRAVENOUS

## 2015-12-14 MED ORDER — DIPHENHYDRAMINE HCL 12.5 MG/5ML PO ELIX: 12.5000 mg | ORAL_SOLUTION | ORAL | Status: DC | PRN

## 2015-12-14 MED ORDER — SODIUM CHLORIDE 0.9 % IR SOLN
Status: DC | PRN
  Administered 2015-12-14: 3000 mL

## 2015-12-14 MED ORDER — ROCURONIUM BROMIDE 100 MG/10ML IV SOLN
INTRAVENOUS | Status: DC | PRN
  Administered 2015-12-14: 10 mg via INTRAVENOUS
  Administered 2015-12-14: 50 mg via INTRAVENOUS
  Administered 2015-12-14: 10 mg via INTRAVENOUS

## 2015-12-14 MED ORDER — SUGAMMADEX SODIUM 200 MG/2ML IV SOLN
INTRAVENOUS | Status: DC | PRN
  Administered 2015-12-14: 200 mg via INTRAVENOUS

## 2015-12-14 MED ORDER — FENTANYL CITRATE (PF) 250 MCG/5ML IJ SOLN
INTRAMUSCULAR | Status: AC
  Filled 2015-12-14: qty 5

## 2015-12-14 MED ORDER — POTASSIUM CHLORIDE IN NACL 20-0.45 MEQ/L-% IV SOLN
INTRAVENOUS | Status: DC
  Administered 2015-12-14 – 2015-12-15 (×2): via INTRAVENOUS
  Filled 2015-12-14 (×5): qty 1000

## 2015-12-14 MED ORDER — FENTANYL CITRATE (PF) 100 MCG/2ML IJ SOLN
25.0000 ug | INTRAMUSCULAR | Status: DC | PRN
  Administered 2015-12-14 (×2): 50 ug via INTRAVENOUS

## 2015-12-14 MED ORDER — ONDANSETRON HCL 4 MG/2ML IJ SOLN: 4.0000 mg | Freq: Four times a day (QID) | INTRAMUSCULAR | Status: DC | PRN

## 2015-12-14 MED ORDER — GLYCOPYRROLATE 0.2 MG/ML IV SOSY
PREFILLED_SYRINGE | INTRAVENOUS | Status: DC | PRN
  Administered 2015-12-14: .2 mg via INTRAVENOUS

## 2015-12-14 MED ORDER — PHENYLEPHRINE HCL 10 MG/ML IJ SOLN
INTRAVENOUS | Status: DC | PRN
  Administered 2015-12-14: 25 ug/min via INTRAVENOUS

## 2015-12-14 MED ORDER — LIDOCAINE 2% (20 MG/ML) 5 ML SYRINGE
INTRAMUSCULAR | Status: DC | PRN
  Administered 2015-12-14: 100 mg via INTRAVENOUS

## 2015-12-14 MED ORDER — EPHEDRINE SULFATE-NACL 50-0.9 MG/10ML-% IV SOSY
PREFILLED_SYRINGE | INTRAVENOUS | Status: DC | PRN
  Administered 2015-12-14: 5 mg via INTRAVENOUS

## 2015-12-14 MED ORDER — ZOLPIDEM TARTRATE 5 MG PO TABS: 5.0000 mg | ORAL_TABLET | Freq: Every evening | ORAL | Status: DC | PRN

## 2015-12-14 MED ORDER — METOCLOPRAMIDE HCL 5 MG/ML IJ SOLN
5.0000 mg | Freq: Three times a day (TID) | INTRAMUSCULAR | Status: DC | PRN
Start: 2015-12-14 — End: 2015-12-15

## 2015-12-14 MED ORDER — ARTIFICIAL TEARS OP OINT
TOPICAL_OINTMENT | OPHTHALMIC | Status: DC | PRN
  Administered 2015-12-14: 1 via OPHTHALMIC

## 2015-12-14 MED ORDER — PHENOL 1.4 % MT LIQD: 1.0000 | OROMUCOSAL | Status: DC | PRN

## 2015-12-14 MED ORDER — METOCLOPRAMIDE HCL 5 MG PO TABS: 5.0000 mg | ORAL_TABLET | Freq: Three times a day (TID) | ORAL | Status: DC | PRN

## 2015-12-14 MED ORDER — PROMETHAZINE HCL 25 MG/ML IJ SOLN: 12.5000 mg | Freq: Once | INTRAMUSCULAR | Status: DC | PRN

## 2015-12-14 MED ORDER — MIDAZOLAM HCL 2 MG/2ML IJ SOLN
INTRAMUSCULAR | Status: AC
  Filled 2015-12-14: qty 2

## 2015-12-14 SURGICAL SUPPLY — 78 items
BASEPLATE P2 COATD GLND 6.5X30 (Shoulder) ×1 IMPLANT
BIT DRILL 5/64X5 DISP (BIT) ×2 IMPLANT
BLADE SAW SAG 73X25 THK (BLADE) ×1
BLADE SAW SGTL 73X25 THK (BLADE) ×1 IMPLANT
BLADE SURG 15 STRL LF DISP TIS (BLADE) ×1 IMPLANT
BLADE SURG 15 STRL SS (BLADE) ×1
BOWL SMART MIX CTS (DISPOSABLE) ×2 IMPLANT
CEMENT BONE DEPUY (Cement) ×2 IMPLANT
CHLORAPREP W/TINT 26ML (MISCELLANEOUS) ×2 IMPLANT
COVER MAYO STAND STRL (DRAPES) IMPLANT
COVER SURGICAL LIGHT HANDLE (MISCELLANEOUS) ×2 IMPLANT
DRAPE INCISE IOBAN 66X45 STRL (DRAPES) ×2 IMPLANT
DRAPE ORTHO SPLIT 77X108 STRL (DRAPES) ×2
DRAPE SURG ORHT 6 SPLT 77X108 (DRAPES) ×2 IMPLANT
DRESSING AQUACEL AG SP 3.5X10 (GAUZE/BANDAGES/DRESSINGS) ×1 IMPLANT
DRSG AQUACEL AG ADV 3.5X10 (GAUZE/BANDAGES/DRESSINGS) IMPLANT
DRSG AQUACEL AG SP 3.5X10 (GAUZE/BANDAGES/DRESSINGS) ×2
ELECT BLADE 4.0 EZ CLEAN MEGAD (MISCELLANEOUS) ×2
ELECT REM PT RETURN 9FT ADLT (ELECTROSURGICAL) ×2
ELECTRODE BLDE 4.0 EZ CLN MEGD (MISCELLANEOUS) ×1 IMPLANT
ELECTRODE REM PT RTRN 9FT ADLT (ELECTROSURGICAL) ×1 IMPLANT
EVACUATOR 1/8 PVC DRAIN (DRAIN) IMPLANT
GLOVE BIO SURGEON STRL SZ7 (GLOVE) ×4 IMPLANT
GLOVE BIO SURGEON STRL SZ7.5 (GLOVE) ×2 IMPLANT
GLOVE BIOGEL PI IND STRL 7.0 (GLOVE) ×3 IMPLANT
GLOVE BIOGEL PI IND STRL 8 (GLOVE) ×1 IMPLANT
GLOVE BIOGEL PI INDICATOR 7.0 (GLOVE) ×3
GLOVE BIOGEL PI INDICATOR 8 (GLOVE) ×1
GLOVE SURG SS PI 7.0 STRL IVOR (GLOVE) ×2 IMPLANT
GOWN STRL REUS W/ TWL LRG LVL3 (GOWN DISPOSABLE) ×3 IMPLANT
GOWN STRL REUS W/ TWL XL LVL3 (GOWN DISPOSABLE) ×1 IMPLANT
GOWN STRL REUS W/TWL LRG LVL3 (GOWN DISPOSABLE) ×3
GOWN STRL REUS W/TWL XL LVL3 (GOWN DISPOSABLE) ×1
HANDPIECE INTERPULSE COAX TIP (DISPOSABLE) ×1
HOOD PEEL AWAY FLYTE STAYCOOL (MISCELLANEOUS) ×4 IMPLANT
INSERT EPOLY STND HUMERUS 32MM (Shoulder) ×2 IMPLANT
INSERT EPOLYSTD HUMERUS 32MM (Shoulder) ×1 IMPLANT
KIT BASIN OR (CUSTOM PROCEDURE TRAY) ×2 IMPLANT
KIT ROOM TURNOVER OR (KITS) ×2 IMPLANT
MANIFOLD NEPTUNE II (INSTRUMENTS) ×2 IMPLANT
NEEDLE HYPO 25GX1X1/2 BEV (NEEDLE) IMPLANT
NEEDLE MAYO TROCAR (NEEDLE) ×2 IMPLANT
NOZZLE PRISM 8.5MM (MISCELLANEOUS) IMPLANT
NS IRRIG 1000ML POUR BTL (IV SOLUTION) ×2 IMPLANT
P2 COATDE GLNOID BSEPLT 6.5X30 (Shoulder) ×2 IMPLANT
PACK SHOULDER (CUSTOM PROCEDURE TRAY) ×2 IMPLANT
PAD ARMBOARD 7.5X6 YLW CONV (MISCELLANEOUS) ×4 IMPLANT
RESTRAINT HEAD UNIVERSAL NS (MISCELLANEOUS) ×2 IMPLANT
RETRIEVER SUT HEWSON (MISCELLANEOUS) IMPLANT
SCREW BONE RSP LOCK 5X22 (Screw) ×3 IMPLANT
SCREW BONE RSP LOCKING 5.0X32 (Screw) ×6 IMPLANT
SCREW RETAIN W/HEAD 4MM OFFSET (Shoulder) ×2 IMPLANT
SET HNDPC FAN SPRY TIP SCT (DISPOSABLE) ×1 IMPLANT
SLING ARM IMMOBILIZER LRG (SOFTGOODS) ×4 IMPLANT
SLING ARM IMMOBILIZER MED (SOFTGOODS) IMPLANT
SPONGE LAP 18X18 X RAY DECT (DISPOSABLE) ×2 IMPLANT
SPONGE LAP 4X18 X RAY DECT (DISPOSABLE) ×2 IMPLANT
STEM REV PRIMARY 8X108 (Shoulder) ×2 IMPLANT
STRIP CLOSURE SKIN 1/2X4 (GAUZE/BANDAGES/DRESSINGS) ×2 IMPLANT
SUCTION FRAZIER HANDLE 10FR (MISCELLANEOUS) ×1
SUCTION TUBE FRAZIER 10FR DISP (MISCELLANEOUS) ×1 IMPLANT
SUPPORT WRAP ARM LG (MISCELLANEOUS) ×2 IMPLANT
SUT ETHIBOND NAB CT1 #1 30IN (SUTURE) ×4 IMPLANT
SUT FIBERWIRE #2 38 T-5 BLUE (SUTURE) ×12
SUT MNCRL AB 4-0 PS2 18 (SUTURE) ×2 IMPLANT
SUT SILK 2 0 TIES 17X18 (SUTURE)
SUT SILK 2-0 18XBRD TIE BLK (SUTURE) IMPLANT
SUT VIC AB 0 CTB1 27 (SUTURE) ×2 IMPLANT
SUT VIC AB 2-0 CT1 27 (SUTURE) ×1
SUT VIC AB 2-0 CT1 TAPERPNT 27 (SUTURE) ×1 IMPLANT
SUTURE FIBERWR #2 38 T-5 BLUE (SUTURE) ×6 IMPLANT
SYR CONTROL 10ML LL (SYRINGE) IMPLANT
SYRINGE TOOMEY DISP (SYRINGE) IMPLANT
TAPE FIBER 2MM 7IN #2 BLUE (SUTURE) ×6 IMPLANT
TOWEL OR 17X24 6PK STRL BLUE (TOWEL DISPOSABLE) ×2 IMPLANT
TOWEL OR 17X26 10 PK STRL BLUE (TOWEL DISPOSABLE) ×2 IMPLANT
WATER STERILE IRR 1000ML POUR (IV SOLUTION) ×2 IMPLANT
YANKAUER SUCT BULB TIP NO VENT (SUCTIONS) IMPLANT

## 2015-12-14 NOTE — Discharge Instructions (Signed)

## 2015-12-14 NOTE — H&P (Signed)
Kendra Fuentes is an 65 y.o. female.   Chief Complaint: R shoulder injury HPI: R comminuted intraarticular proximal humerus fracture s/p fall.  Past Medical History:  Diagnosis Date  . Anxiety   . Arthritis   . Borderline diabetes   . Depression   . Endometrial cancer ([place])   . GERD (gastroesophageal reflux disease)   . Graves disease   . Graves disease    radiatiated  . Heart murmur   . Leg swelling    left ankle  . Light-headed feeling    "I eat something then I'm okay"  . Radiation 04/16/15, 04/21/15, 04/23/15, 04/28/15, 04/30/15   vaginal cuff 30 gray  . Urgency of urination     Past Surgical History:  Procedure Laterality Date  . CHOLECYSTECTOMY    . COLONOSCOPY    . KNEE SURGERY Left   . LAPAROSCOPIC PELVIC LYMPH NODE BIOPSY  01/30/15   by Dr. Polly Cobia at [facility] ([facility]) DISTAL RADIAL FRACTURE Left 09/23/2013   Procedure: OPEN TREATMENT OF LEFT DISTAL RADIUS AND SCAPHOID FRACTURES;  Surgeon: Jolyn Nap, MD;  Location: [facility];  Service: Orthopedics;  Laterality: Left;  . ROBOTIC ASSISTED SUPRACERVICAL HYSTERECTOMY WITH BILATERAL SALPINGO OOPHERECTOMY  01/30/15   by Dr. Polly Cobia at [facility]    Family History  Problem Relation Age of Onset  . Endometrial cancer Sister    Social History:  reports that she has never smoked. She has never used smokeless tobacco. She reports that she does not drink alcohol or use drugs.  Allergies:  Allergies  Allergen Reactions  . Codeine Nausea And Vomiting  . Penicillins Other (See Comments)    UNSPECIFIED  "Childhood allergy"    Medications Prior to Admission  Medication Sig Dispense Refill  . citalopram (CELEXA) 20 MG tablet Take 20 mg by mouth daily.    Marland Kitchen HYDROcodone-acetaminophen (NORCO) 7.5-325 MG tablet Take 1-2 tablets by mouth every 4 (four) hours as needed for moderate pain.    Marland Kitchen levothyroxine (SYNTHROID, LEVOTHROID) 150 MCG tablet Take 150 mcg by mouth daily  before breakfast.    . OVER THE COUNTER MEDICATION Apply 1 application topically as needed (for itching). OTC Benadryl Spray    . ibuprofen (ADVIL,MOTRIN) 800 MG tablet Take 1 tablet (800 mg total) by mouth 3 (three) times daily. (Patient not taking: Reported on 04/16/2015) 21 tablet 0  . ondansetron (ZOFRAN ODT) 4 MG disintegrating tablet Take 1 tablet (4 mg total) by mouth every 4 (four) hours as needed for nausea or vomiting. (Patient not taking: Reported on 04/08/2015) 20 tablet 0  . oxyCODONE-acetaminophen (PERCOCET) 5-325 MG per tablet Take 1-2 tablets by mouth every 4 (four) hours as needed for severe pain. (Patient not taking: Reported on 04/08/2015) 80 tablet 0    No results found for this or any previous visit (from the past 48 hour(s)). No results found.  Review of Systems  All other systems reviewed and are negative.   Blood pressure (!) 133/53, pulse (!) 58, temperature 98.1 F (36.7 C), resp. rate 20, SpO2 100 %. Physical Exam  Constitutional: She is oriented to person, place, and time. She appears well-developed and well-nourished.  HENT:  Head: Atraumatic.  Eyes: EOM are normal.  Cardiovascular: Intact distal pulses.   Respiratory: Effort normal.  Musculoskeletal:  R shoulder mild swelling/ecch NVID  Neurological: She is alert and oriented to person, place, and time.  Skin: Skin is warm and dry.  Psychiatric: She has  a normal mood and affect.     Assessment/Plan R comminuted intraarticular head split proximal humerus fracture s/p fall. Plan R reverse TSA Risks / benefits of surgery discussed Consent on chart  NPO for OR Preop antibiotics   Nita Sells, MD 12/14/2015, 2:37 PM

## 2015-12-14 NOTE — Anesthesia Postprocedure Evaluation (Signed)
Anesthesia Post Note  Patient: Kendra Fuentes  Procedure(s) Performed: Procedure(s) (LRB): REVERSE SHOULDER ARTHROPLASTY (Right)  Patient location during evaluation: PACU Anesthesia Type: General Level of consciousness: awake and alert Pain management: pain level controlled Vital Signs Assessment: post-procedure vital signs reviewed and stable Respiratory status: spontaneous breathing, nonlabored ventilation and respiratory function stable Cardiovascular status: blood pressure returned to baseline and stable Postop Assessment: no signs of nausea or vomiting Anesthetic complications: no    Last Vitals:  Vitals:   12/14/15 1839 12/14/15 2000  BP: (!) 102/56 (!) 118/57  Pulse: (!) 56 (!) 59  Resp: 13 16  Temp:      Last Pain:  Vitals:   12/14/15 1830  PainSc: 5                  Keiron Iodice A

## 2015-12-14 NOTE — Op Note (Signed)
Procedure(s): REVERSE SHOULDER ARTHROPLASTY Procedure Note  Kendra Fuentes female 65 y.o. 12/14/2015  Procedure(s) and Anesthesia Type:    * RIGHT REVERSE SHOULDER ARTHROPLASTY    Indications:  65 y.o. female right intra-articular head split proximal humerus fracture dislocation. Indicated for surgical treatment to try and improve functional outcome.      Surgeon: Nita Sells   Assistants: Jeanmarie Hubert PA-C [facility] was present and scrubbed throughout the procedure and was essential in positioning, retraction, exposure, and closure)  Anesthesia: General endotracheal anesthesia with preoperative interscalene block given by the attending anesthesiologist    Procedure Detail  REVERSE SHOULDER ARTHROPLASTY   Estimated Blood Loss:  400 mL         Drains: none  Blood Given: none          Specimens: none        Complications:  * No complications entered in OR log *         Disposition: PACU - hemodynamically stable.         Condition: stable      OPERATIVE FINDINGS:  A DJO Altivate cemented reverse total shoulder arthroplasty was placed with a  size 8 stem, a 32-4 glenosphere, and a standard poly insert. The base plate  fixation was good.  PROCEDURE: The patient was identified in the preoperative holding area  where I personally marked the operative site after verifying site, side,  and procedure with the patient. An interscalene block given by  the attending anesthesiologist in the holding area and the patient was taken back to the operating room where all extremities were  carefully padded in position after general anesthesia was induced. She  was placed in a beach-chair position and the operative upper extremity was  prepped and draped in a standard sterile fashion. An approximately 10-  cm incision was made from the tip of the coracoid process to the center  point of the humerus at the level of the axilla. Dissection was carried  down through  subcutaneous tissues to the level of the cephalic vein  which was taken laterally with the deltoid. The pectoralis major was  retracted medially. The subdeltoid space was developed and the lateral  edge of the conjoined tendon was identified. The undersurface of  conjoined tendon was palpated and the musculocutaneous nerve was not in  the field. Retractor was placed underneath the conjoined and second  retractor was placed lateral into the deltoid. The fracture planes were carefully developed with a Cobb elevator. The biceps tendon was identified and tenotomized. Once the 2 head fragments were separated adequately a osteotome was used to resect the articular head leaving the lesser tuberosities intact. She was noted to have a supraspinatus tendon tear which appeared to be pre-existing from this injury. There was a small separate lateral cortical fragment which was attached to some soft tissue and was left in continuity. Once the lesser and greater tuberosities were controlled and the shaft was freed from proximal tissues enough to allow exposure attention was turned to the glenoid.  The anterior and posterior labrum were  completely excised and the capsule was released circumferentially to  allow for exposure of the glenoid for preparation. The 2.5 mm drill was  placed using the guide in 5-10 inferior angulation and the tap was then advanced in the same hole. Small and large reamers were then used. The tap was then removed and the Metaglene was then screwed in with Good purchase.  The peripheral guide was then used to  drilled measured and filled peripheral locking screws with the exception of the anterior which did not have adequate bone given the small size of her glenoid . The size  32-4 glenosphere was then impacted on the [facility] taper and the central screw was placed.  The proximal  humerus was then again exposed and the diaphyseal reamers were used to sound the canal and size the implant. The size 8  implant was selected. Trial was placed to estimate height and retroversion. With trial reduction tensions were felt to be appropriate. Therefore the final size 8 stem was cemented into place in about 10 retroversion. Cement was allowed to completely hardened. The joint was then reduced and the tuberosities and rotator cuff were repaired around the implant with previously placed FiberWire sutures with a total of 4 sutures repairing the lesser and greater tuberosity to the implant. Anterior and posterior cuff were stout, with the superior joint remaining open. The joint was then copiously irrigated with pulse  lavage and the wound was then closed.  Skin was closed with 2-0 Vicryl in a deep dermal layer and 4-0  Monocryl for skin closure. Steri-Strips were applied. Sterile  dressings were then applied as well as a sling. The patient was allowed  to awaken from general anesthesia, transferred to stretcher, and taken  to recovery room in stable condition.   POSTOPERATIVE PLAN: The patient will be kept in the hospital postoperatively  for pain control  and antibiotics. She will remain in her sling for about 6 weeks postoperatively with hand wrist and elbow motion only while the tuberosities heal.

## 2015-12-14 NOTE — Anesthesia Procedure Notes (Addendum)
Anesthesia Regional Block:  Interscalene brachial plexus block  Pre-Anesthetic Checklist: ,, timeout performed, Correct Patient, Correct Site, Correct Laterality, Correct Procedure, Correct Position, site marked, Risks and benefits discussed,  Surgical consent,  Pre-op evaluation,  At surgeon's request and post-op pain management  Laterality: Right  Prep: chloraprep       Needles:  Injection technique: Single-shot  Needle Type: Other     Needle Length: 9cm 9 cm Needle Gauge: 21 and 21 G  Needle insertion depth: 5 cm   Additional Needles:  Procedures: nerve stimulator Interscalene brachial plexus block Narrative:  Start time: 12/14/2015 2:31 PM End time: 12/14/2015 2:41 PM Injection made incrementally with aspirations every 5 mL.  Performed by: Personally  Anesthesiologist: Lillia Abed  Additional Notes: Monitors applied. Patient sedated. Sterile prep and drape,hand hygiene and sterile gloves were used. Needle position confirmed with evoked response at 0.4 mV.Local anesthetic injected incrementally after negative aspiration.Vascular puncture avoided. No complications. The patient tolerated the procedure well.

## 2015-12-14 NOTE — Transfer of Care (Signed)
Immediate Anesthesia Transfer of Care Note  Patient: Kendra Fuentes  Procedure(s) Performed: Procedure(s) with comments: REVERSE SHOULDER ARTHROPLASTY (Right) - Right reverse total shoulder arthroplasty  Patient Location: PACU  Anesthesia Type:GA combined with regional for post-op pain  Level of Consciousness: awake, alert , oriented and patient cooperative  Airway & Oxygen Therapy: Patient Spontanous Breathing and Patient connected to nasal cannula oxygen  Post-op Assessment: Report given to RN and Post -op Vital signs reviewed and stable  Post vital signs: Reviewed and stable  Last Vitals:  Vitals:   12/14/15 1257 12/14/15 1753  BP: (!) 133/53 (!) 125/57  Pulse: (!) 58 78  Resp: 20 17  Temp: 36.7 C     Last Pain:  Vitals:   12/14/15 1310  PainSc: 4       Patients Stated Pain Goal: 5 ([id number])  Complications: No apparent anesthesia complications

## 2015-12-14 NOTE — Anesthesia Procedure Notes (Signed)
Procedure Name: Intubation Date/Time: 12/14/2015 3:03 PM Performed by: Bethel Born Pre-anesthesia Checklist: Patient identified, Emergency Drugs available, Suction available and Patient being monitored Patient Re-evaluated:Patient Re-evaluated prior to inductionOxygen Delivery Method: Circle system utilized Preoxygenation: Pre-oxygenation with 100% oxygen Intubation Type: IV induction Ventilation: Mask ventilation without difficulty Laryngoscope Size: Miller and 2 Grade View: Grade I Tube type: Oral Tube size: 7.0 mm Number of attempts: 1 Airway Equipment and Method: Stylet Placement Confirmation: ETT inserted through vocal cords under direct vision,  positive ETCO2 and breath sounds checked- equal and bilateral Secured at: 21 cm Tube secured with: Tape Dental Injury: Teeth and Oropharynx as per pre-operative assessment

## 2015-12-14 NOTE — Anesthesia Procedure Notes (Signed)
Anesthesia Procedure Image    

## 2015-12-15 LAB — CBC
HCT: 29.4 % — ABNORMAL LOW (ref 36.0–46.0)
HEMOGLOBIN: 9.2 g/dL — AB (ref 12.0–15.0)
MCH: 30.9 pg (ref 26.0–34.0)
MCHC: 31.3 g/dL (ref 30.0–36.0)
MCV: 98.7 fL (ref 78.0–100.0)
PLATELETS: 263 10*3/uL (ref 150–400)
RBC: 2.98 MIL/uL — AB (ref 3.87–5.11)
RDW: 13.9 % (ref 11.5–15.5)
WBC: 5.2 10*3/uL (ref 4.0–10.5)

## 2015-12-15 LAB — BASIC METABOLIC PANEL
ANION GAP: 6 (ref 5–15)
BUN: 14 mg/dL (ref 6–20)
CO2: 28 mmol/L (ref 22–32)
Calcium: 9.1 mg/dL (ref 8.9–10.3)
Chloride: 103 mmol/L (ref 101–111)
Creatinine, Ser: 0.83 mg/dL (ref 0.44–1.00)
Glucose, Bld: 106 mg/dL — ABNORMAL HIGH (ref 65–99)
POTASSIUM: 4.2 mmol/L (ref 3.5–5.1)
SODIUM: 137 mmol/L (ref 135–145)

## 2015-12-15 MED ORDER — DOCUSATE SODIUM 100 MG PO CAPS: 100.0000 mg | ORAL_CAPSULE | Freq: Three times a day (TID) | ORAL | 0 refills | Status: AC | PRN

## 2015-12-15 MED ORDER — OXYCODONE-ACETAMINOPHEN 5-325 MG PO TABS: 1.0000 | ORAL_TABLET | ORAL | 0 refills | Status: AC | PRN

## 2015-12-15 MED ORDER — CIPROFLOXACIN HCL 500 MG PO TABS: 500.0000 mg | ORAL_TABLET | Freq: Two times a day (BID) | ORAL | 0 refills | Status: AC

## 2015-12-15 NOTE — Progress Notes (Signed)
   PATIENT ID: Kendra Fuentes   1 Day Post-Op Procedure(s) (LRB): REVERSE SHOULDER ARTHROPLASTY (Right)  Subjective: Doing well, block has worn off. Pain 6-7/10 and tolerable. Denies dizziness, lightheadedness. Getting up and walking. Reports dark and discomfort with urination. No other complaints or concerns.   Objective:  Vitals:   12/15/15 0554 12/15/15 0600  BP: (!) 83/48 (!) 92/40  Pulse: (!) 57   Resp: 15   Temp: 98.1 F (36.7 C)      R UE dressing c/d/i No distally swelling Distally NVI  Labs:   Recent Labs  12/15/15 0322  HGB 9.2*   Recent Labs  12/15/15 0322  WBC 5.2  RBC 2.98*  HCT 29.4*  PLT 263   Recent Labs  12/15/15 0322  NA 137  K 4.2  CL 103  CO2 28  BUN 14  CREATININE 0.83  GLUCOSE 106*  CALCIUM 9.1    Assessment and Plan: 1 day s/p right reverse TSA for fracture OT- hand wrist elbow ROM only Sling D/c home today when cleared by OT ABLA and slighly hypotensive- asymptomatic and will continue to monitor Fu with Dr. Tamera Punt in 2 weeks preop urinalysis + for bacteria and with UTI sx will tx with cipro x 3 days, script in chart  VTE proph: ASA 325mg  BID, SCDs

## 2015-12-15 NOTE — Evaluation (Signed)
Occupational Therapy Evaluation and Discharge Patient Details Name: Kendra Fuentes MRN: [id number] DOB: [birth year] Today's Date: 12/15/2015    History of Present Illness s/p R reverse TSA following a fall with proximal humerus fx. PMH: L wrist fx   Clinical Impression   Pt educated in positioning R shoulder, sling use, compensatory strategies for ADL and AROM of R elbow, forearm, wrist and hand. Pt and friend verbalizing understanding of all information. Pt is ready for d/c from OT standpoint. Pt has excellent support of her family and friends upon d/c.    Follow Up Recommendations  No OT follow up;Supervision - Intermittent    Equipment Recommendations  None recommended by OT    Recommendations for Other Services       Precautions / Restrictions Precautions Precautions: Shoulder Type of Shoulder Precautions: conservative protocol Shoulder Interventions: Shoulder sling/immobilizer;Off for dressing/bathing/exercises Precaution Booklet Issued: Yes (comment) Required Braces or Orthoses: Sling Restrictions Weight Bearing Restrictions: Yes (NWB R UE)      Mobility Bed Mobility Overal bed mobility: Modified Independent             General bed mobility comments: no physical assist, comfortable only with HOB up maximally  Transfers Overall transfer level: Modified independent                    Balance                                            ADL Overall ADL's : Needs assistance/impaired Eating/Feeding: Set up;Sitting   Grooming: Wash/dry hands;Supervision/safety;Standing     Upper Body Bathing Details (indicate cue type and reason): educated in technique for UB bathing Lower Body Bathing: Supervison/ safety;Sit to/from stand   Upper Body Dressing : Minimal assistance;Sitting;Cueing for compensatory techniques Upper Body Dressing Details (indicate cue type and reason): instructed to dress R UE first and undress it last, getting sleeve  above elbow before putting L UE in, front opening shirts easier Lower Body Dressing: Supervision/safety;Sit to/from stand Lower Body Dressing Details (indicate cue type and reason): instructed in one handed technique Toilet Transfer: Supervision/safety;Ambulation   Toileting- Clothing Manipulation and Hygiene: Supervision/safety;Sit to/from stand       Functional mobility during ADLs: Supervision/safety (for safety and IV pole management) General ADL Comments: Instructed in positioning in bed and chair, sling use.     Vision     Perception     Praxis      Pertinent Vitals/Pain Pain Assessment: 0-10 Pain Score: 8  Pain Location: R shoulder Pain Descriptors / Indicators: Aching;Guarding Pain Intervention(s): Limited activity within patient's tolerance;Monitored during session;Premedicated before session;Repositioned;Ice applied     Hand Dominance Left   Extremity/Trunk Assessment Upper Extremity Assessment Upper Extremity Assessment: RUE deficits/detail (arthritic changes in B hands) RUE Deficits / Details: performed elbow, forearm, wrist and hand AROM within pt's tolerance RUE: Unable to fully assess due to pain;Unable to fully assess due to immobilization RUE Coordination: decreased gross motor   Lower Extremity Assessment Lower Extremity Assessment: Overall WFL for tasks assessed       Communication Communication Communication: No difficulties   Cognition Arousal/Alertness: Awake/alert Behavior During Therapy: WFL for tasks assessed/performed Overall Cognitive Status: Within Functional Limits for tasks assessed                     General Comments  Exercises Exercises: Other exercises Other Exercises Other Exercises: L elbow, forearm, wrist and hand x 10. Pt not able to achieve full elbow extension due to pain.   Shoulder Instructions      Home Living Family/patient expects to be discharged to:: Private residence Living Arrangements: Other  relatives (sister) Available Help at Discharge: Family;Friend(s);Available 24 hours/day                                    Prior Functioning/Environment Level of Independence: Independent        Comments: Pt teaches roller skating teams.    OT Diagnosis: Generalized weakness;Acute pain   OT Problem List:     OT Treatment/Interventions:      OT Goals(Current goals can be found in the care plan section) Acute Rehab OT Goals Patient Stated Goal: regain use of R shoulder  OT Frequency:     Barriers to D/C:            Co-evaluation              End of Session    Activity Tolerance: Patient limited by pain Patient left: in bed;with call bell/phone within reach;with family/visitor present   Time: [id number] OT Time Calculation (min): 33 min Charges:  OT General Charges $OT Visit: 1 Procedure OT Evaluation $OT Eval Low Complexity: 1 Procedure OT Treatments $Self Care/Home Management : 8-22 mins G-Codes:    Malka So 12/15/2015, 9:28 AM  [phone]

## 2015-12-15 NOTE — Progress Notes (Signed)
Pt discharged from unit to [id number] with fgriend escorting. All belongings with pt. Discharge information and rx reviewed with pt. NO distress noted, no c/o .

## 2015-12-16 ENCOUNTER — Encounter (HOSPITAL_COMMUNITY): Payer: Self-pay | Admitting: Orthopedic Surgery

## 2015-12-17 NOTE — Discharge Summary (Signed)
Patient ID: Kendra Fuentes MRN: [id number] DOB/AGE: 06-12-1950 65 y.o.  Admit date: 12/14/2015 Discharge date: 12/15/2015  Admission Diagnoses:  Active Problems:   S/p reverse total shoulder arthroplasty   Discharge Diagnoses:  Same  Past Medical History:  Diagnosis Date  . Anxiety   . Arthritis   . Borderline diabetes   . Depression   . Endometrial cancer ([place])   . GERD (gastroesophageal reflux disease)   . Graves disease   . Graves disease    radiatiated  . Heart murmur   . Leg swelling    left ankle  . Light-headed feeling    "I eat something then I'm okay"  . Radiation 04/16/15, 04/21/15, 04/23/15, 04/28/15, 04/30/15   vaginal cuff 30 gray  . Urgency of urination     Surgeries: Procedure(s): REVERSE SHOULDER ARTHROPLASTY on 12/14/2015   Consultants:   Discharged Condition: Improved  Hospital Course: Kendra Fuentes is an 66 y.o. female who was admitted 12/14/2015 for operative treatment of comminuted intaarticular proximal humerus fracture. requiring operative repair to restore function and decrease pain. After pre-op clearance the patient was taken to the operating room on 12/14/2015 and underwent  Procedure(s): [place].    Patient was given perioperative antibiotics: Anti-infectives    Start     Dose/Rate Route Frequency Ordered Stop   12/15/15 0800  ciprofloxacin (CIPRO) tablet 500 mg  Status:  Discontinued     500 mg Oral 2 times daily 12/14/15 1910 12/15/15 1523   12/15/15 0000  ciprofloxacin (CIPRO) 500 MG tablet     500 mg Oral 2 times daily 12/15/15 0713     12/14/15 2000  clindamycin (CLEOCIN) IVPB 600 mg  Status:  Discontinued     600 mg 100 mL/hr over 30 Minutes Intravenous Every 6 hours 12/14/15 1910 12/15/15 1523   12/14/15 1400  vancomycin (VANCOCIN) IVPB 1000 mg/200 mL premix     1,000 mg 200 mL/hr over 60 Minutes Intravenous On call to O.R. 12/14/15 1324 12/14/15 1608   12/14/15 1252  vancomycin (VANCOCIN) 1-5 GM/200ML-% IVPB     Comments:  Leandrew Koyanagi   : cabinet override      12/14/15 1252 12/15/15 0059       Patient was given sequential compression devices, early ambulation, and ASA 325mg  BID to prevent DVT.  Patient benefited maximally from hospital stay and there were no complications.  Urinalysis suggested UTI, treated with Cipro x 3 days.  Recent vital signs: No data found.    Recent laboratory studies:  Recent Labs  12/15/15 0322  WBC 5.2  HGB 9.2*  HCT 29.4*  PLT 263  NA 137  K 4.2  CL 103  CO2 28  BUN 14  CREATININE 0.83  GLUCOSE 106*  CALCIUM 9.1     Discharge Medications:     Medication List    STOP taking these medications   HYDROcodone-acetaminophen 7.5-325 MG tablet Commonly known as:  NORCO   ibuprofen 800 MG tablet Commonly known as:  ADVIL,MOTRIN     TAKE these medications   ciprofloxacin 500 MG tablet Commonly known as:  CIPRO Take 1 tablet (500 mg total) by mouth 2 (two) times daily.   citalopram 20 MG tablet Commonly known as:  CELEXA Take 20 mg by mouth daily.   docusate sodium 100 MG capsule Commonly known as:  COLACE Take 1 capsule (100 mg total) by mouth 3 (three) times daily as needed.   levothyroxine 150 MCG tablet Commonly known as:  SYNTHROID, LEVOTHROID  Take 150 mcg by mouth daily before breakfast.   ondansetron 4 MG disintegrating tablet Commonly known as:  ZOFRAN ODT Take 1 tablet (4 mg total) by mouth every 4 (four) hours as needed for nausea or vomiting.   OVER THE COUNTER MEDICATION Apply 1 application topically as needed (for itching). OTC Benadryl Spray   oxyCODONE-acetaminophen 5-325 MG tablet Commonly known as:  PERCOCET Take 1-2 tablets by mouth every 4 (four) hours as needed for severe pain.       Diagnostic Studies: Dg Chest 1 View  Result Date: 12/08/2015 CLINICAL DATA:  Preoperative examination prior right shoulder surgery, no current chest complaints EXAM: CHEST 1 VIEW COMPARISON:  None in PACs FINDINGS: The lungs  are adequately inflated and clear. The heart and pulmonary vascularity are normal. The mediastinum is normal in width. There is no pleural effusion. There are lateral endplate spurs on the right in the mid and lower thoracic spine. IMPRESSION: There is no active cardiopulmonary disease. Electronically Signed   By: David  Martinique M.D.   On: 12/08/2015 14:02   Dg Shoulder Right Port  Result Date: 12/14/2015 CLINICAL DATA:  Postop reverse total shoulder arthroplasty. EXAM: PORTABLE RIGHT SHOULDER - 2+ VIEW COMPARISON:  Chest x-ray 12/08/2015 FINDINGS: Well seated components of reverse total shoulder arthroplasty for trauma. Humeral neck fractures are noted. Remote healed clavicle fracture. IMPRESSION: Well seated components of a reversed total shoulder arthroplasty without complicating features. Humeral fractures are noted as seen on the prior chest film. Electronically Signed   By: Marijo Sanes M.D.   On: 12/14/2015 18:29    Disposition: 01-Home or Self Care  Discharge Instructions    Call MD / Call 911    Complete by:  As directed   If you experience chest pain or shortness of breath, CALL 911 and be transported to the hospital emergency room.  If you develope a fever above 101 F, pus (white drainage) or increased drainage or redness at the wound, or calf pain, call your surgeon's office.   Constipation Prevention    Complete by:  As directed   Drink plenty of fluids.  Prune juice may be helpful.  You may use a stool softener, such as Colace (over the counter) 100 mg twice a day.  Use MiraLax (over the counter) for constipation as needed.   Diet - low sodium heart healthy    Complete by:  As directed   Increase activity slowly as tolerated    Complete by:  As directed      Follow-up Information    Nita Sells, MD. Schedule an appointment as soon as possible for a visit in 2 weeks.   Specialty:  Orthopedic Surgery Contact information: [address] [place]  [id number] [phone]            Signed: Grier Mitts 12/17/2015, 10:08 AM

## 2015-12-30 ENCOUNTER — Ambulatory Visit (HOSPITAL_COMMUNITY)
Admission: RE | Admit: 2015-12-30 | Discharge: 2015-12-30 | Disposition: A | Discharge status: Home / Self Care | Payer: BLUE CROSS/BLUE SHIELD | Source: Ambulatory Visit | Attending: Orthopedic Surgery | Admitting: Orthopedic Surgery

## 2015-12-30 ENCOUNTER — Other Ambulatory Visit (HOSPITAL_COMMUNITY): Payer: Self-pay | Admitting: Orthopedic Surgery

## 2015-12-30 DIAGNOSIS — I8392 Asymptomatic varicose veins of left lower extremity: Secondary | ICD-10-CM | POA: Insufficient documentation

## 2015-12-30 DIAGNOSIS — R609 Edema, unspecified: Secondary | ICD-10-CM

## 2015-12-30 NOTE — Progress Notes (Signed)
*  PRELIMINARY RESULTS* Vascular Ultrasound Left lower extremity venous duplex has been completed.  Preliminary findings: No evidence of DVT or baker's cyst. Non-thrombosed varicose veins noted in the left calf.    Called results to [place].    Landry Mellow, RDMS, RVT  12/30/2015, 3:37 PM

## 2017-11-07 ENCOUNTER — Other Ambulatory Visit: Payer: Self-pay

## 2017-11-07 ENCOUNTER — Emergency Department (HOSPITAL_COMMUNITY)
Admission: EM | Admit: 2017-11-07 | Discharge: 2017-11-08 | Disposition: A | Discharge status: Home / Self Care | Payer: BLUE CROSS/BLUE SHIELD | Attending: Emergency Medicine | Admitting: Emergency Medicine

## 2017-11-07 DIAGNOSIS — Z8541 Personal history of malignant neoplasm of cervix uteri: Secondary | ICD-10-CM | POA: Diagnosis not present

## 2017-11-07 DIAGNOSIS — Z79899 Other long term (current) drug therapy: Secondary | ICD-10-CM | POA: Diagnosis not present

## 2017-11-07 DIAGNOSIS — R2242 Localized swelling, mass and lump, left lower limb: Secondary | ICD-10-CM | POA: Diagnosis not present

## 2017-11-07 DIAGNOSIS — M25571 Pain in right ankle and joints of right foot: Secondary | ICD-10-CM | POA: Insufficient documentation

## 2017-11-07 DIAGNOSIS — M7989 Other specified soft tissue disorders: Secondary | ICD-10-CM

## 2017-11-07 NOTE — ED Triage Notes (Signed)
Pt also states she is concerned she is diabetic.

## 2017-11-07 NOTE — ED Triage Notes (Signed)
Pt presents with multiple complaints, pt has a notebook that has a list with her complaints. Pt states her L leg has been swollen for 3 months, pt reports SOB "for some times" also reports dental pain. Pt states her PCP is in [place] has not felt like driving all the way over there.

## 2017-11-08 ENCOUNTER — Ambulatory Visit (HOSPITAL_BASED_OUTPATIENT_CLINIC_OR_DEPARTMENT_OTHER)
Admission: RE | Admit: 2017-11-08 | Discharge: 2017-11-08 | Disposition: A | Discharge status: Home / Self Care | Payer: BLUE CROSS/BLUE SHIELD | Source: Ambulatory Visit | Attending: Emergency Medicine | Admitting: Emergency Medicine

## 2017-11-08 DIAGNOSIS — M7989 Other specified soft tissue disorders: Secondary | ICD-10-CM

## 2017-11-08 LAB — BASIC METABOLIC PANEL
Anion gap: 10 (ref 5–15)
BUN: 21 mg/dL (ref 8–23)
CALCIUM: 9.9 mg/dL (ref 8.9–10.3)
CO2: 25 mmol/L (ref 22–32)
CREATININE: 1.19 mg/dL — AB (ref 0.44–1.00)
Chloride: 104 mmol/L (ref 98–111)
GFR calc Af Amer: 55 mL/min — ABNORMAL LOW (ref >60)
GFR calc non Af Amer: 47 mL/min — ABNORMAL LOW (ref >60)
GLUCOSE: 100 mg/dL — AB (ref 70–99)
Potassium: 3.9 mmol/L (ref 3.5–5.1)
Sodium: 139 mmol/L (ref 135–145)

## 2017-11-08 LAB — URINALYSIS, ROUTINE W REFLEX MICROSCOPIC
BILIRUBIN URINE: NEGATIVE
GLUCOSE, UA: NEGATIVE mg/dL
Hgb urine dipstick: NEGATIVE
KETONES UR: NEGATIVE mg/dL
Leukocytes, UA: NEGATIVE
Nitrite: NEGATIVE
PH: 6 (ref 5.0–8.0)
Protein, ur: NEGATIVE mg/dL
Specific Gravity, Urine: 1.011 (ref 1.005–1.030)

## 2017-11-08 LAB — CBC
HEMATOCRIT: 38.5 % (ref 36.0–46.0)
Hemoglobin: 12.1 g/dL (ref 12.0–15.0)
MCH: 30.5 pg (ref 26.0–34.0)
MCHC: 31.4 g/dL (ref 30.0–36.0)
MCV: 97 fL (ref 78.0–100.0)
Platelets: 231 10*3/uL (ref 150–400)
RBC: 3.97 MIL/uL (ref 3.87–5.11)
RDW: 13.9 % (ref 11.5–15.5)
WBC: 5.2 10*3/uL (ref 4.0–10.5)

## 2017-11-08 MED ORDER — ENOXAPARIN SODIUM 100 MG/ML ~~LOC~~ SOLN
1.0000 mg/kg | Freq: Once | SUBCUTANEOUS | Status: AC
  Administered 2017-11-08: 100 mg via SUBCUTANEOUS
  Filled 2017-11-08: qty 1

## 2017-11-08 NOTE — Discharge Instructions (Signed)
You have been evaluated for your left leg swelling.  Please call and follow up in the morning for venous doppler ultrasound of your left leg to ensure no evidence of blood clot.  If test is normal, call and follow up closely with your primary care provider for further management.  Your labs are normal today, no evidence of high blood sugar.

## 2017-11-08 NOTE — ED Provider Notes (Signed)
[facility] EMERGENCY DEPARTMENT Provider Note   CSN: [id number] Arrival date & time: 11/07/17  2220     History   Chief Complaint Chief Complaint  Patient presents with  . Multiple Complaints    HPI Kendra Fuentes is a 67 y.o. female.  The history is provided by the patient. No language interpreter was used.     67 year old female with history of endometrial cancer, anxiety, borderline diabetes, Graves' disease, recurrent leg swelling presenting with multiple complaints.  Patient mention for the past 3 months she has had swelling to her left lower extremity.  It is waxing waning but she noticed it is more prominent within the past month.  She noticed indentation from her socks when she remove it each night.  Endorsed tightness sensation to her left lower extremity.  She also complaining of pain to her right ankle for the past month.  Pain is described as an achy throbbing sensation more noticeable early in the morning when she tries to walk.  Symptoms did improve throughout the day and recurred again the next day.  She tries Tylenol on occasion with minimal improvement.  Pain is moderate in severity.  She endorsed occasional chest tightness without significant shortness of breath.  She mentioned having a dental infection currently being treated with antibiotic.  Patient also was concern for potential diabetes and would like to have her blood sugar checked.  She mentioned not feeling well.  Patient denies fever, lightheadedness, dizziness, productive cough, hemoptysis, focal numbness or weakness.  No prior history of PE or DVT.  Past Medical History:  Diagnosis Date  . Anxiety   . Arthritis   . Borderline diabetes   . Depression   . Endometrial cancer ([place])   . GERD (gastroesophageal reflux disease)   . Graves disease   . Graves disease    radiatiated  . Heart murmur   . Leg swelling    left ankle  . Light-headed feeling    "I eat something then I'm okay"  .  Radiation 04/16/15, 04/21/15, 04/23/15, 04/28/15, 04/30/15   vaginal cuff 30 gray  . Urgency of urination     Patient Active Problem List   Diagnosis Date Noted  . S/p reverse total shoulder arthroplasty 12/14/2015  . Endometrial cancer ([place]) 04/08/2015    Past Surgical History:  Procedure Laterality Date  . CHOLECYSTECTOMY    . COLONOSCOPY    . KNEE SURGERY Left   . LAPAROSCOPIC PELVIC LYMPH NODE BIOPSY  01/30/15   by Dr. Polly Cobia at [facility] ([facility]) DISTAL RADIAL FRACTURE Left 09/23/2013   Procedure: OPEN TREATMENT OF LEFT DISTAL RADIUS AND SCAPHOID FRACTURES;  Surgeon: Jolyn Nap, MD;  Location: [facility];  Service: Orthopedics;  Laterality: Left;  . REVERSE SHOULDER ARTHROPLASTY Right 12/14/2015   Procedure: REVERSE SHOULDER ARTHROPLASTY;  Surgeon: Tania Ade, MD;  Location: [facility];  Service: Orthopedics;  Laterality: Right;  Right reverse total shoulder arthroplasty  . ROBOTIC ASSISTED SUPRACERVICAL HYSTERECTOMY WITH BILATERAL SALPINGO OOPHERECTOMY  01/30/15   by Dr. Polly [facility] at [facility]     OB History   None      Home Medications    Prior to Admission medications   Medication Sig Start Date End Date Taking? Authorizing Provider  ciprofloxacin (CIPRO) 500 MG tablet Take 1 tablet (500 mg total) by mouth 2 (two) times daily. 12/15/15   Grier Mitts, PA-C  citalopram (CELEXA) 20 MG tablet Take 20 mg  by mouth daily.    [provider]  docusate sodium (COLACE) 100 MG capsule Take 1 capsule (100 mg total) by mouth 3 (three) times daily as needed. 12/15/15   Grier Mitts, PA-C  levothyroxine (SYNTHROID, LEVOTHROID) 150 MCG tablet Take 150 mcg by mouth daily before breakfast.    [provider]  ondansetron (ZOFRAN ODT) 4 MG disintegrating tablet Take 1 tablet (4 mg total) by mouth every 4 (four) hours as needed for nausea or vomiting. Patient not taking: Reported on 04/08/2015 09/16/13    Sherwood Gambler, MD  OVER THE COUNTER MEDICATION Apply 1 application topically as needed (for itching). OTC Benadryl Spray    [provider]  oxyCODONE-acetaminophen (PERCOCET) 5-325 MG tablet Take 1-2 tablets by mouth every 4 (four) hours as needed for severe pain. 12/15/15   Grier Mitts, PA-C    Family History Family History  Problem Relation Age of Onset  . Endometrial cancer Sister     Social History Social History   Tobacco Use  . Smoking status: Never Smoker  . Smokeless tobacco: Never Used  Substance Use Topics  . Alcohol use: No  . Drug use: No     Allergies   Codeine and Penicillins   Review of Systems Review of Systems  All other systems reviewed and are negative.    Physical Exam Updated Vital Signs BP 131/67 (BP Location: Right Arm)   Pulse 68   Temp 98.2 F (36.8 C) (Oral)   Resp 17   Ht 5\' 2"  (1.575 m)   Wt 98 kg (216 lb)   SpO2 100%   BMI 39.51 kg/m   Physical Exam  Constitutional: She appears well-developed and well-nourished. No distress.  HENT:  Head: Atraumatic.  Eyes: Conjunctivae are normal.  Neck: Neck supple. No JVD present.  Cardiovascular: Normal rate and regular rhythm.  Pulmonary/Chest: Effort normal and breath sounds normal. No stridor. No respiratory distress. She has no wheezes. She has no rales. She exhibits no tenderness.  Abdominal: Soft. She exhibits no distension. There is no tenderness.  Musculoskeletal: She exhibits edema (Left lower extremity edema with 1+ pitting edema to the mid tib-fib with intact pedal pulses and brisk cap refill.) and tenderness (Right lower extremity without any edema.  Tenderness to the dorsum of right ankle with full range of motion, no deformity, dorsalis pedis pulse palpable with brisk cap refill.).  Neurological: She is alert.  Skin: No rash noted.  Psychiatric: She has a normal mood and affect.  Nursing note and vitals reviewed.    ED Treatments / Results  Labs (all labs  ordered are listed, but only abnormal results are displayed) Labs Reviewed  BASIC METABOLIC PANEL - Abnormal; Notable for the following components:      Result Value   Glucose, Bld 100 (*)    Creatinine, Ser 1.19 (*)    GFR calc non Af Amer 47 (*)    GFR calc Af Amer 55 (*)    All other components within normal limits  URINALYSIS, ROUTINE W REFLEX MICROSCOPIC - Abnormal; Notable for the following components:   Color, Urine STRAW (*)    All other components within normal limits  CBC    EKG EKG Interpretation  Date/Time:  Tuesday November 07 2017 22:46:44 EDT Ventricular Rate:  77 PR Interval:  164 QRS Duration: 82 QT Interval:  370 QTC Calculation: 418 R Axis:   51 Text Interpretation:  Normal sinus rhythm Normal ECG Confirmed by Thayer Jew [phone]) on 11/08/2017 12:47:30 AM  Radiology No results found.  Procedures Procedures (including critical care time)  Medications Ordered in ED Medications  enoxaparin (LOVENOX) injection 100 mg (has no administration in time range)     Initial Impression / Assessment and Plan / ED Course  I have reviewed the triage vital signs and the nursing notes.  Pertinent labs & imaging results that were available during my care of the patient were reviewed by me and considered in my medical decision making (see chart for details).     BP 131/67 (BP Location: Right Arm)   Pulse 68   Temp 98.2 F (36.8 C) (Oral)   Resp 17   Ht 5\' 2"  (1.575 m)   Wt 98 kg (216 lb)   SpO2 100%   BMI 39.51 kg/m    Final Clinical Impressions(s) / ED Diagnoses   Final diagnoses:  Left leg swelling  Arthralgia of ankle, right    ED Discharge Orders        Ordered    VAS [place] LOWER EXTREMITY VENOUS (DVT)     11/08/17 0134     1:24 AM Patient with several different complaints but her primary complaint is left lower extremity edema for the past 3 months.  She does have history of cancer which predispose her to thrombo-embolic event.  She does have  noticeable left lower extremity edema as compared to right.  She does not have any shortness of breath currently.  She is not hypoxic and vital signs stable.  Plan to give Lovenox shot tonight and patient will return tomorrow for venous Doppler study of her left lower extremity to rule out DVT.  Her labs otherwise reassuring, no hyperglycemia and no electrolyte derangement.  Low suspicion for CHF causing unilateral leg swelling.  History of cancer, query lymphedema.   Domenic Moras, PA-C 11/08/17 0136    Merryl Hacker, MD 11/08/17 [phone]

## 2017-11-08 NOTE — Progress Notes (Signed)
Preliminary results by tech - Left Lower Ext. Venous Duplex Completed. Negative for deep and superficial vein thrombosis.  Martyn Timme, BS, RDMS, RVT  

## 2020-11-26 ENCOUNTER — Other Ambulatory Visit: Payer: Self-pay | Admitting: Family Medicine

## 2020-11-26 DIAGNOSIS — R600 Localized edema: Secondary | ICD-10-CM

## 2020-12-09 ENCOUNTER — Other Ambulatory Visit: Payer: Self-pay

## 2020-12-09 ENCOUNTER — Ambulatory Visit (HOSPITAL_COMMUNITY): Payer: Medicare Other | Attending: Family Medicine | Admitting: Physical Therapy

## 2020-12-09 DIAGNOSIS — M79605 Pain in left leg: Secondary | ICD-10-CM | POA: Insufficient documentation

## 2020-12-09 DIAGNOSIS — I890 Lymphedema, not elsewhere classified: Secondary | ICD-10-CM | POA: Insufficient documentation

## 2020-12-09 NOTE — Therapy (Addendum)
[place], [place], [address] Phone: [phone]   Fax:  [phone]  Physical Therapy Evaluation  Patient Details  Name: Kendra Fuentes MRN: [id number] Date of Birth: 20-Mar-1951   Encounter Date: 12/09/2020   PT End of Session - 12/09/20 1459     Visit Number 1    Number of Visits 12    Date for PT Re-Evaluation 01/13/21   Pt unable to start therapy until next week   Authorization Type U[employer] Medicare    Progress Note Due on Visit 10    PT Start Time 0915    PT Stop Time 1040    PT Time Calculation (min) 85 min    Activity Tolerance Patient tolerated treatment well    Behavior During Therapy [facility] for tasks assessed/performed             Past Medical History:  Diagnosis Date   Anxiety    Arthritis    Borderline diabetes    Depression    Endometrial cancer ([place])    GERD (gastroesophageal reflux disease)    Graves disease    Graves disease    radiatiated   Heart murmur    Leg swelling    left ankle   Light-headed feeling    "I eat something then I'm okay"   Radiation 04/16/15, 04/21/15, 04/23/15, 04/28/15, 04/30/15   vaginal cuff 30 gray   Urgency of urination     Past Surgical History:  Procedure Laterality Date   CHOLECYSTECTOMY     COLONOSCOPY     KNEE SURGERY Left    LAPAROSCOPIC PELVIC LYMPH NODE BIOPSY  01/30/15   by Dr. Polly Cobia at [place] ([facility]) DISTAL RADIAL FRACTURE Left 09/23/2013   Procedure: OPEN TREATMENT OF LEFT DISTAL RADIUS AND SCAPHOID FRACTURES;  Surgeon: Jolyn Nap, MD;  Location: [facility];  Service: Orthopedics;  Laterality: Left;   REVERSE SHOULDER ARTHROPLASTY Right 12/14/2015   Procedure: REVERSE SHOULDER ARTHROPLASTY;  Surgeon: Tania Ade, MD;  Location: [facility];  Service: Orthopedics;  Laterality: Right;  Right reverse total shoulder arthroplasty   ROBOTIC ASSISTED SUPRACERVICAL HYSTERECTOMY WITH BILATERAL SALPINGO  OOPHERECTOMY  01/30/15   by Dr. Polly Cobia at [facility]    There were no vitals filed for this visit.    Subjective Assessment - 12/09/20 0928     Subjective Kendra Fuentes states that she has had that she had endometrial cancer she had a full hysterectomy.  She is not sure if they took any lymphnodes from her groin or about five years ago.  A year ago she noted that she was having some swellling in her left leg prior to having treatment done on her veins.  She has  chronic venous stasis which were becoming more painful therefore she opted to start having treatments on her veins six months ago.  She is still having treatments and will not resume until she is done with the lymphedema therapy.  She does not feel that she has an issue on her Rt at this time just her left.    Pertinent History endometrial cancer, anxiety, graves dz, LT  leg swelling.    Patient Stated Goals less leg swelling    Currently in Pain? Yes   due to venous insufficiency as well as swelling   Pain Score 5     Pain Location Leg    Pain Orientation Left    Pain Descriptors / Indicators  Aching    Pain Type Chronic pain    Pain Frequency Constant                [facility] PT Assessment - 12/09/20 0001       Assessment   Medical Diagnosis Lt LE lymphedema    Referring Provider (PT) Charolette Child    Onset Date/Surgical Date 05/23/20    Prior Therapy none      Precautions   Precautions --   cellulitis     Restrictions   Weight Bearing Restrictions No      Balance Screen   Has the patient fallen in the past 6 months No      [place] residence      Prior Function   Level of Independence Independent      Cognition   Overall Cognitive Status Within Functional Limits for tasks assessed      Observation/Other Assessments   Skin Integrity noted rednessand edema  of Lt LE               LYMPHEDEMA/ONCOLOGY QUESTIONNAIRE - 12/09/20 0001       Treatment   Past  Radiation Treatment Yes    Date --   unknown   Body Site pelvic      What other symptoms do you have   Are you Having Heaviness or Tightness Yes    Are you having Pain Yes    Are you having pitting edema Yes    Body Site LE    Is it Hard or Difficult finding clothes that fit Yes    Do you have infections No      Lymphedema Stage   Stage STAGE 2 SPONTANEOUSLY IRREVERSIBLE      Lymphedema Assessments   Lymphedema Assessments Lower extremities      Right Lower Extremity Lymphedema   20 cm Proximal to Suprapatella 71.5 cm    10 cm Proximal to Suprapatella 60.3 cm    At Midpatella/Popliteal Crease 46.3 cm    30 cm Proximal to Floor at Lateral Plantar Foot 48 cm    20 cm Proximal to Floor at Lateral Plantar Foot 37 1    10 cm Proximal to Floor at Lateral Malleoli 23.3 cm    Circumference of ankle/heel 31.8 cm.    5 cm Proximal to 1st MTP Joint 22.3 cm    Across MTP Joint 22 cm      Left Lower Extremity Lymphedema   20 cm Proximal to Suprapatella 75.5 cm    10 cm Proximal to Suprapatella 64.4 cm    At Midpatella/Popliteal Crease 50 cm    30 cm Proximal to Floor at Lateral Plantar Foot 49 cm    20 cm Proximal to Floor at Lateral Plantar Foot 37.5 cm    10 cm Proximal to Floor at Lateral Malleoli 28.2 cm    Circumference of ankle/heel 32.3 cm.    5 cm Proximal to 1st MTP Joint 23.2 cm    Across MTP Joint 21.9 cm                     Objective measurements completed on examination: See above findings.               PT Education - 12/09/20 1458     Education Details What lymphedema is, what causes it, how it is controlled but not curable, LE exercises to increase lymphatic circulation.    Person(s) Educated Patient  Methods Explanation;Handout    Comprehension Verbalized understanding              PT Short Term Goals - 12/09/20 1521       PT SHORT TERM GOAL #1   Title PT to have lost between 2-3 cm from measurements in Lt LE    Time 2     Period Weeks    Status New    Target Date 12/23/20      PT SHORT TERM GOAL #2   Title PT to state that her pain has decreased to no greater than a 2/10    Time 2    Period Weeks    Status New               PT Long Term Goals - 12/09/20 1522       PT LONG TERM GOAL #1   Title PT to have lost 3-4 cm of fluid from LE where applicable.    Time 4    Period Weeks    Status New    Target Date 01/11/21      PT LONG TERM GOAL #2   Title PT to verbalize that she would do best with thigh high compression garments, if this is not obtainable due to fit capri with knee highs should be worn daily    Time 4    Period Weeks    Status New      PT LONG TERM GOAL #3   Title PT to have obtained and be using a compression pump to assist in decreasing edema and improving venous return.    Time 6    Period Weeks    Status New                    Plan - 12/09/20 1506     Clinical Impression Statement Ms. Madera is a 70 yo female who has a hx of total hysterectomy following a diagnosis of endometrial cancer.  She also has chronic venous insufficiency.  She had noted that she was having some swelling in her Lt LE. She started having vein treatments due to the increased pain in her legs and noted that the swelling has increased.  She is being referred for lymphedema decongestive therapy by her physician.  Pt has induration, increased swelling in her LT LE ; noted chronic venous insufficiency in B LE.  She will benefit from education on lymphedema and how it is controlled, a compression pump for home use as well as total decongestive techniques.    Personal Factors and Comorbidities Comorbidity 3+    Comorbidities CVI, endometrial cancer in the past with total hysterectomy, Graves DZ    Examination-Activity Limitations Locomotion Level;Sit;Stand    Examination-Participation Restrictions Cleaning;Community Activity    Stability/Clinical Decision Making Stable/Uncomplicated    Clinical  Decision Making Moderate    Rehab Potential Good    PT Frequency 3x / week    PT Duration 4 weeks    PT Treatment/Interventions Therapeutic exercise;Manual techniques;Manual lymph drainage;Patient/family education    PT Next Visit Plan Begin total decongestive techniques for Lt LE.  PT foam has been cut and is in the lower cabinet.    PT Home Exercise Plan ankle pumps, LAQ, hip ab/adduction, marching, diaphragmic breathing and lymphatic squeeze.             Patient will benefit from skilled therapeutic intervention in order to improve the following deficits and impairments:  Decreased skin integrity, Increased edema, Pain,  Decreased mobility, Difficulty walking  Visit Diagnosis: Lymphedema, not elsewhere classified  Pain in left leg     Problem List Patient Active Problem List   Diagnosis Date Noted   S/p reverse total shoulder arthroplasty 12/14/2015   Endometrial cancer [facility]) 04/08/2015   [name], [name] [phone]  12/09/2020, 3:26 PM  [facility] [address] [place], [place], [address] Phone: [phone]   Fax:  [phone]  Name: Kendra Fuentes MRN: [id number] Date of Birth: Jul 30, 1950

## 2020-12-10 ENCOUNTER — Ambulatory Visit (HOSPITAL_COMMUNITY): Payer: Medicare Other

## 2020-12-14 ENCOUNTER — Encounter (HOSPITAL_COMMUNITY): Payer: Medicare Other | Admitting: Physical Therapy

## 2020-12-15 ENCOUNTER — Other Ambulatory Visit: Payer: Self-pay

## 2020-12-15 ENCOUNTER — Ambulatory Visit
Admission: RE | Admit: 2020-12-15 | Discharge: 2020-12-15 | Disposition: A | Discharge status: Home / Self Care | Payer: Medicare Other | Source: Ambulatory Visit | Attending: Family Medicine | Admitting: Family Medicine

## 2020-12-15 DIAGNOSIS — R600 Localized edema: Secondary | ICD-10-CM

## 2020-12-15 MED ORDER — IOPAMIDOL (ISOVUE-370) INJECTION 76%
125.0000 mL | Freq: Once | INTRAVENOUS | Status: AC | PRN
  Administered 2020-12-15: 125 mL via INTRAVENOUS

## 2020-12-15 NOTE — Addendum Note (Signed)
Addended by: Leeroy Cha on: 12/15/2020 11:22 AM   Modules accepted: Orders

## 2020-12-16 ENCOUNTER — Encounter (HOSPITAL_COMMUNITY): Payer: Medicare Other | Admitting: Physical Therapy

## 2020-12-16 NOTE — Addendum Note (Signed)
Addended by: Leeroy Cha on: 12/16/2020 04:37 PM   Modules accepted: Orders

## 2020-12-18 ENCOUNTER — Encounter (HOSPITAL_COMMUNITY): Payer: Medicare Other | Admitting: Physical Therapy

## 2020-12-21 ENCOUNTER — Encounter (HOSPITAL_COMMUNITY): Payer: Medicare Other | Admitting: Physical Therapy

## 2020-12-22 ENCOUNTER — Ambulatory Visit (HOSPITAL_COMMUNITY): Payer: Medicare Other

## 2020-12-22 ENCOUNTER — Encounter (HOSPITAL_COMMUNITY): Payer: Self-pay

## 2020-12-22 ENCOUNTER — Other Ambulatory Visit: Payer: Self-pay

## 2020-12-22 DIAGNOSIS — M79605 Pain in left leg: Secondary | ICD-10-CM

## 2020-12-22 DIAGNOSIS — I890 Lymphedema, not elsewhere classified: Secondary | ICD-10-CM | POA: Diagnosis not present

## 2020-12-22 NOTE — Therapy (Signed)
[facility] [address] [place], [place], [address] Phone: [phone]   Fax:  [phone]  Physical Therapy Treatment  Patient Details  Name: REMEDY MCKNEELY MRN: [id number] Date of Birth: 07-24-1950 Referring Provider (PT): Charolette Child   Encounter Date: 12/22/2020   PT End of Session - 12/22/20 1016     Visit Number 2    Number of Visits 12    Date for PT Re-Evaluation 01/13/21    Authorization Type UHC Medicare    Progress Note Due on Visit 10    PT Start Time 0928    PT Stop Time 1007    PT Time Calculation (min) 39 min    Activity Tolerance Patient tolerated treatment well    Behavior During Therapy [facility] for tasks assessed/performed             Past Medical History:  Diagnosis Date   Anxiety    Arthritis    Borderline diabetes    Depression    Endometrial cancer ([place])    GERD (gastroesophageal reflux disease)    Graves disease    Graves disease    radiatiated   Heart murmur    Leg swelling    left ankle   Light-headed feeling    "I eat something then I'm okay"   Radiation 04/16/15, 04/21/15, 04/23/15, 04/28/15, 04/30/15   vaginal cuff 30 gray   Urgency of urination     Past Surgical History:  Procedure Laterality Date   CHOLECYSTECTOMY     COLONOSCOPY     KNEE SURGERY Left    LAPAROSCOPIC PELVIC LYMPH NODE BIOPSY  01/30/15   by Dr. Polly Cobia at [place] ([facility]) DISTAL RADIAL FRACTURE Left 09/23/2013   Procedure: OPEN TREATMENT OF LEFT DISTAL RADIUS AND SCAPHOID FRACTURES;  Surgeon: Jolyn Nap, MD;  Location: [place];  Service: Orthopedics;  Laterality: Left;   REVERSE SHOULDER ARTHROPLASTY Right 12/14/2015   Procedure: REVERSE SHOULDER ARTHROPLASTY;  Surgeon: Tania Ade, MD;  Location: [place];  Service: Orthopedics;  Laterality: Right;  Right reverse total shoulder arthroplasty   ROBOTIC ASSISTED SUPRACERVICAL HYSTERECTOMY WITH BILATERAL SALPINGO  OOPHERECTOMY  01/30/15   by Dr. Polly Cobia at [facility]    There were no vitals filed for this visit.   Subjective Assessment - 12/22/20 0930     Subjective No reports of pain, skin feels tight Lt LE.  Has recieved pump and completeing exercises daily.    Pertinent History endometrial cancer, anxiety, graves dz, LT  leg swelling.    Patient Stated Goals less leg swelling    Currently in Pain? No/denies    Pain Score 0-No pain    Pain Location Leg    Pain Orientation Left    Pain Descriptors / Indicators Tightness                               [facility] Adult PT Treatment/Exercise - 12/22/20 0001       Manual Therapy   Manual Therapy Manual Lymphatic Drainage (MLD);Compression Bandaging    Manual therapy comments Manual complete separate than rest of tx    Manual Lymphatic Drainage (MLD) Manual lymphedema decongestive techniques for inguinal to axillary anastomosis anterior only due to time    Compression Bandaging Multilayer short stretch bandages thigh high with 1/2in foam  PT Education - 12/22/20 1016     Education Details Educated 4 areas of lymphedema care including skin care, exercise, manual and bandages.  Educated length of time to wear and to roll bandages back before next apt.  Not to use pump with dressing on, to continue exercises with bandages on.    Person(s) Educated Patient    Methods Explanation    Comprehension Verbalized understanding;Returned demonstration              PT Short Term Goals - 12/09/20 1521       PT SHORT TERM GOAL #1   Title PT to have lost between 2-3 cm from measurements in Lt LE    Time 2    Period Weeks    Status New    Target Date 12/23/20      PT SHORT TERM GOAL #2   Title PT to state that her pain has decreased to no greater than a 2/10    Time 2    Period Weeks    Status New               PT Long Term Goals - 12/09/20 1522       PT LONG TERM GOAL #1   Title PT to  have lost 3-4 cm of fluid from LE where applicable.    Time 4    Period Weeks    Status New    Target Date 01/11/21      PT LONG TERM GOAL #2   Title PT to verbalize that she would do best with thigh high compression garments, if this is not obtainable due to fit capri with knee highs should be worn daily    Time 4    Period Weeks    Status New      PT LONG TERM GOAL #3   Title PT to have obtained and be using a compression pump to assist in decreasing edema and improving venous return.    Time 6    Period Weeks    Status New                   Plan - 12/22/20 1019     Clinical Impression Statement Reviewed goals and educated 4 components of lymphedema care.  Manual decongestive lymphedema techniques complete to anterior only as pt was late arrival for session.  Application of multilayer short stretch bandages with 1/2in foam.  Educated length of time to wear bandages and to re-roll prior arrival for next apt.  Reports of comfort at EOS.  Encouraged pt to purchase wide shoes for increased ease ambulating wiht dressings on.    Personal Factors and Comorbidities Comorbidity 3+    Comorbidities CVI, endometrial cancer in the past with total hysterectomy, Graves DZ    Examination-Activity Limitations Locomotion Level;Sit;Stand    Examination-Participation Restrictions Cleaning;Community Activity    Stability/Clinical Decision Making Stable/Uncomplicated    Clinical Decision Making Moderate    Rehab Potential Good    PT Frequency 3x / week    PT Duration 4 weeks    PT Treatment/Interventions Therapeutic exercise;Manual techniques;Manual lymph drainage;Patient/family education    PT Next Visit Plan Begin total decongestive techniques for Lt LE, multilayer short stretch bandages to thigh.    PT Home Exercise Plan ankle pumps, LAQ, hip ab/adduction, marching, diaphragmic breathing and lymphatic squeeze.    Consulted and Agree with Plan of Care Patient             Patient  will benefit from skilled therapeutic intervention in order to improve the following deficits and impairments:  Decreased skin integrity, Increased edema, Pain, Decreased mobility, Difficulty walking  Visit Diagnosis: Pain in left leg  Lymphedema, not elsewhere classified     Problem List Patient Active Problem List   Diagnosis Date Noted   S/p reverse total shoulder arthroplasty 12/14/2015   Endometrial cancer ([place]) 04/08/2015   Ihor Austin, LPTA/CLT; CBIS [phone]  Aldona Lento 12/22/2020, 10:23 AM  [place], [place], [address] Phone: [phone]   Fax:  [phone]  Name: FAITH REDDIX MRN: [id number] Date of Birth: [birth year]

## 2020-12-23 ENCOUNTER — Ambulatory Visit (HOSPITAL_COMMUNITY): Payer: Medicare Other | Admitting: Physical Therapy

## 2020-12-23 DIAGNOSIS — M79605 Pain in left leg: Secondary | ICD-10-CM

## 2020-12-23 DIAGNOSIS — I890 Lymphedema, not elsewhere classified: Secondary | ICD-10-CM

## 2020-12-23 NOTE — Therapy (Signed)
[facility] [address] [place], [place], [address] Phone: [phone]   Fax:  [phone]  Physical Therapy Treatment  Patient Details  Name: Kendra Fuentes MRN: [id number] Date of Birth: 1950/07/12 Referring Provider (PT): Charolette Child   Encounter Date: 12/23/2020   PT End of Session - 12/23/20 1219     Visit Number 3    Number of Visits 12    Date for PT Re-Evaluation 01/13/21    Authorization Type UHC Medicare    Progress Note Due on Visit 10    PT Start Time 1130    PT Stop Time 1210    PT Time Calculation (min) 40 min    Activity Tolerance Patient tolerated treatment well    Behavior During Therapy [facility] for tasks assessed/performed             Past Medical History:  Diagnosis Date   Anxiety    Arthritis    Borderline diabetes    Depression    Endometrial cancer ([place])    GERD (gastroesophageal reflux disease)    Graves disease    Graves disease    radiatiated   Heart murmur    Leg swelling    left ankle   Light-headed feeling    "I eat something then I'm okay"   Radiation 04/16/15, 04/21/15, 04/23/15, 04/28/15, 04/30/15   vaginal cuff 30 gray   Urgency of urination     Past Surgical History:  Procedure Laterality Date   CHOLECYSTECTOMY     COLONOSCOPY     KNEE SURGERY Left    LAPAROSCOPIC PELVIC LYMPH NODE BIOPSY  01/30/15   by Dr. Polly Cobia at [place] ([facility]) DISTAL RADIAL FRACTURE Left 09/23/2013   Procedure: OPEN TREATMENT OF LEFT DISTAL RADIUS AND SCAPHOID FRACTURES;  Surgeon: Jolyn Nap, MD;  Location: [place] [facility];  Service: Orthopedics;  Laterality: Left;   REVERSE SHOULDER ARTHROPLASTY Right 12/14/2015   Procedure: REVERSE SHOULDER ARTHROPLASTY;  Surgeon: Tania Ade, MD;  Location: [facility];  Service: Orthopedics;  Laterality: Right;  Right reverse total shoulder arthroplasty   ROBOTIC ASSISTED SUPRACERVICAL HYSTERECTOMY WITH BILATERAL SALPINGO  OOPHERECTOMY  01/30/15   by Dr. Polly Cobia at [facility]    There were no vitals filed for this visit.   Subjective Assessment - 12/23/20 1214     Subjective PT very pleased with results of total decongestive techniques.  Has no questions.    Pertinent History endometrial cancer, anxiety, graves dz, LT  leg swelling.    Patient Stated Goals less leg swelling    Currently in Pain? No/denies                               [facility] Adult PT Treatment/Exercise - 12/23/20 0001       Manual Therapy   Manual Therapy Manual Lymphatic Drainage (MLD);Compression Bandaging    Manual therapy comments Manual complete separate than rest of tx    Manual Lymphatic Drainage (MLD) Manual lymphedema decongestive techniques for supraclavicular, deep and superficial abdominal, Lt inguinal  axillary anastomosis and ineringuinal anastomosis.  Completed both anterior and posterior    Compression Bandaging Multilayer short stretch bandages thigh high with 1/2in foam                      PT Short Term Goals - 12/09/20 1521       PT SHORT  TERM GOAL #1   Title PT to have lost between 2-3 cm from measurements in Lt LE    Time 2    Period Weeks    Status New    Target Date 12/23/20      PT SHORT TERM GOAL #2   Title PT to state that her pain has decreased to no greater than a 2/10    Time 2    Period Weeks    Status New               PT Long Term Goals - 12/09/20 1522       PT LONG TERM GOAL #1   Title PT to have lost 3-4 cm of fluid from LE where applicable.    Time 4    Period Weeks    Status New    Target Date 01/11/21      PT LONG TERM GOAL #2   Title PT to verbalize that she would do best with thigh high compression garments, if this is not obtainable due to fit capri with knee highs should be worn daily    Time 4    Period Weeks    Status New      PT LONG TERM GOAL #3   Title PT to have obtained and be using a compression pump to assist in decreasing  edema and improving venous return.    Time 6    Period Weeks    Status New                   Plan - 12/23/20 1219     Clinical Impression Statement Pt has noted reduction of edema.  Noted induration along medial aspect of Lower leg and posterior thigh with manual.  Pt will continue to benefit from skilled therapy for optimal reduction.    Personal Factors and Comorbidities Comorbidity 3+    Comorbidities CVI, endometrial cancer in the past with total hysterectomy, Graves DZ    Examination-Activity Limitations Locomotion Level;Sit;Stand    Examination-Participation Restrictions Cleaning;Community Activity    Stability/Clinical Decision Making Stable/Uncomplicated    Rehab Potential Good    PT Frequency 3x / week    PT Duration 4 weeks    PT Treatment/Interventions Therapeutic exercise;Manual techniques;Manual lymph drainage;Patient/family education    PT Next Visit Plan Measure next session. continue decongestive techniques for Lt LE, multilayer short stretch bandages to thigh.    PT Home Exercise Plan ankle pumps, LAQ, hip ab/adduction, marching, diaphragmic breathing and lymphatic squeeze.    Consulted and Agree with Plan of Care Patient             Patient will benefit from skilled therapeutic intervention in order to improve the following deficits and impairments:  Decreased skin integrity, Increased edema, Pain, Decreased mobility, Difficulty walking  Visit Diagnosis: Pain in left leg  Lymphedema, not elsewhere classified     Problem List Patient Active Problem List   Diagnosis Date Noted   S/p reverse total shoulder arthroplasty 12/14/2015   Endometrial cancer [facility]) 04/08/2015   [name], [name] [phone]  12/23/2020, 12:21 PM  [facility], [place], [address] Phone: [phone]   Fax:  [phone]  Name: Kendra Fuentes MRN: [id number] Date of Birth: 1950-12-29

## 2020-12-25 ENCOUNTER — Other Ambulatory Visit: Payer: Self-pay

## 2020-12-25 ENCOUNTER — Ambulatory Visit (HOSPITAL_COMMUNITY): Payer: Medicare Other | Admitting: Physical Therapy

## 2020-12-25 DIAGNOSIS — M79605 Pain in left leg: Secondary | ICD-10-CM

## 2020-12-25 DIAGNOSIS — I890 Lymphedema, not elsewhere classified: Secondary | ICD-10-CM | POA: Diagnosis not present

## 2020-12-25 NOTE — Therapy (Signed)
[facility] [place], [place], [address] Phone: [phone]   Fax:  [phone]  Physical Therapy Treatment  Patient Details  Name: Kendra Fuentes MRN: [id number] Date of Birth: Jun 30, 1950 Referring Provider (PT): Charolette Child   Encounter Date: 12/25/2020   PT End of Session - 12/25/20 1541     Visit Number 4    Number of Visits 12    Date for PT Re-Evaluation 01/13/21    Authorization Type UHC Medicare    Progress Note Due on Visit 10    PT Start Time 1450    PT Stop Time 1535    PT Time Calculation (min) 45 min    Activity Tolerance Patient tolerated treatment well    Behavior During Therapy [facility] for tasks assessed/performed             Past Medical History:  Diagnosis Date   Anxiety    Arthritis    Borderline diabetes    Depression    Endometrial cancer ([place])    GERD (gastroesophageal reflux disease)    Graves disease    Graves disease    radiatiated   Heart murmur    Leg swelling    left ankle   Light-headed feeling    "I eat something then I'm okay"   Radiation 04/16/15, 04/21/15, 04/23/15, 04/28/15, 04/30/15   vaginal cuff 30 gray   Urgency of urination     Past Surgical History:  Procedure Laterality Date   CHOLECYSTECTOMY     COLONOSCOPY     KNEE SURGERY Left    LAPAROSCOPIC PELVIC LYMPH NODE BIOPSY  01/30/15   by Dr. Polly Cobia at [place] ([facility]) DISTAL RADIAL FRACTURE Left 09/23/2013   Procedure: OPEN TREATMENT OF LEFT DISTAL RADIUS AND SCAPHOID FRACTURES;  Surgeon: Jolyn Nap, MD;  Location: [place];  Service: Orthopedics;  Laterality: Left;   REVERSE SHOULDER ARTHROPLASTY Right 12/14/2015   Procedure: REVERSE SHOULDER ARTHROPLASTY;  Surgeon: Tania Ade, MD;  Location: [place];  Service: Orthopedics;  Laterality: Right;  Right reverse total shoulder arthroplasty   ROBOTIC ASSISTED SUPRACERVICAL HYSTERECTOMY WITH BILATERAL SALPINGO  OOPHERECTOMY  01/30/15   by Dr. Polly Cobia at [facility]    There were no vitals filed for this visit.   Subjective Assessment - 12/25/20 1540     Subjective Pt doing well, states that her knee bandages became loose.    Pertinent History endometrial cancer, anxiety, graves dz, LT  leg swelling.    Patient Stated Goals less leg swelling    Currently in Pain? No/denies                   LYMPHEDEMA/ONCOLOGY QUESTIONNAIRE - 12/25/20 0001       Left Lower Extremity Lymphedema   20 cm Proximal to Suprapatella 75 cm    10 cm Proximal to Suprapatella 61.3 cm    At Midpatella/Popliteal Crease 49 cm    30 cm Proximal to Floor at Lateral Plantar Foot 47.7 cm    20 cm Proximal to Floor at Lateral Plantar Foot 38.5 cm    10 cm Proximal to Floor at Lateral Malleoli 27.8 cm    Circumference of ankle/heel 32.3 cm.    5 cm Proximal to 1st MTP Joint 22.4 cm    Across MTP Joint 21.3 cm  [facility] Adult PT Treatment/Exercise - 12/25/20 0001       Manual Therapy   Manual Therapy Manual Lymphatic Drainage (MLD);Compression Bandaging    Manual therapy comments Manual complete separate than rest of tx    Manual Lymphatic Drainage (MLD) Manual lymphedema decongestive techniques for supraclavicular, deep and superficial abdominal, Lt inguinal  axillary anastomosis and ineringuinal anastomosis.  Completed both anterior and posterior    Compression Bandaging Multilayer short stretch bandages thigh high with 1/2in foam                      PT Short Term Goals - 12/25/20 1543       PT SHORT TERM GOAL #1   Title PT to have lost between 2-3 cm from measurements in Lt LE    Time 2    Period Weeks    Status On-going    Target Date 12/23/20      PT SHORT TERM GOAL #2   Title PT to state that her pain has decreased to no greater than a 2/10    Time 2    Period Weeks    Status Achieved               PT Long Term Goals - 12/25/20 1542        PT LONG TERM GOAL #1   Title PT to have lost 3-4 cm of fluid from LE where applicable.    Time 4    Period Weeks    Status On-going      PT LONG TERM GOAL #2   Title PT to verbalize that she would do best with thigh high compression garments, if this is not obtainable due to fit capri with knee highs should be worn daily    Time 4    Period Weeks    Status On-going      PT LONG TERM GOAL #3   Title PT to have obtained and be using a compression pump to assist in decreasing edema and improving venous return.    Time 6    Period Weeks    Status On-going                   Plan - 12/25/20 1541     Clinical Impression Statement LE measured with noted decreased volume.  Pt induration areas are decreased especially in the thigh.  Pt will continue to benefit from skilled PT for total decongestive techniques.    Personal Factors and Comorbidities Comorbidity 3+    Comorbidities CVI, endometrial cancer in the past with total hysterectomy, Graves DZ    Examination-Activity Limitations Locomotion Level;Sit;Stand    Examination-Participation Restrictions Cleaning;Community Activity    Stability/Clinical Decision Making Stable/Uncomplicated    Rehab Potential Good    PT Frequency 3x / week    PT Duration 4 weeks    PT Treatment/Interventions Therapeutic exercise;Manual techniques;Manual lymph drainage;Patient/family education             Patient will benefit from skilled therapeutic intervention in order to improve the following deficits and impairments:  Decreased skin integrity, Increased edema, Pain, Decreased mobility, Difficulty walking  Visit Diagnosis: Pain in left leg     Problem List Patient Active Problem List   Diagnosis Date Noted   S/p reverse total shoulder arthroplasty 12/14/2015   Endometrial cancer [facility]) 04/08/2015   [name], [name] [phone]  12/25/2020, 3:43 PM  [facility] [address]  [place], [place], [address] Phone: [phone]   Fax:  [phone]  Name: Kendra Fuentes MRN: [id number] Date of Birth: Jan 19, 1951

## 2020-12-28 ENCOUNTER — Other Ambulatory Visit: Payer: Self-pay

## 2020-12-28 ENCOUNTER — Ambulatory Visit (HOSPITAL_COMMUNITY): Payer: Medicare Other | Admitting: Physical Therapy

## 2020-12-28 DIAGNOSIS — I890 Lymphedema, not elsewhere classified: Secondary | ICD-10-CM | POA: Diagnosis not present

## 2020-12-28 DIAGNOSIS — M79605 Pain in left leg: Secondary | ICD-10-CM

## 2020-12-28 NOTE — Therapy (Signed)
[facility] [address] [place], [place], [address] Phone: [phone]   Fax:  [phone]  Physical Therapy Treatment  Patient Details  Name: Kendra Fuentes MRN: [id number] Date of Birth: 1950-08-16 Referring Provider (PT): Charolette Child   Encounter Date: 12/28/2020   PT End of Session - 12/28/20 1247     Visit Number 5    Number of Visits 12    Date for PT Re-Evaluation 01/13/21    Authorization Type UHC Medicare    Progress Note Due on Visit 10    PT Start Time 1130    PT Stop Time 1215    PT Time Calculation (min) 45 min             Past Medical History:  Diagnosis Date   Anxiety    Arthritis    Borderline diabetes    Depression    Endometrial cancer ([place])    GERD (gastroesophageal reflux disease)    Graves disease    Graves disease    radiatiated   Heart murmur    Leg swelling    left ankle   Light-headed feeling    "I eat something then I'm okay"   Radiation 04/16/15, 04/21/15, 04/23/15, 04/28/15, 04/30/15   vaginal cuff 30 gray   Urgency of urination     Past Surgical History:  Procedure Laterality Date   CHOLECYSTECTOMY     COLONOSCOPY     KNEE SURGERY Left    LAPAROSCOPIC PELVIC LYMPH NODE BIOPSY  01/30/15   by Dr. Polly Cobia at [place] ([facility]) DISTAL RADIAL FRACTURE Left 09/23/2013   Procedure: OPEN TREATMENT OF LEFT DISTAL RADIUS AND SCAPHOID FRACTURES;  Surgeon: Jolyn Nap, MD;  Location: [facility];  Service: Orthopedics;  Laterality: Left;   REVERSE SHOULDER ARTHROPLASTY Right 12/14/2015   Procedure: REVERSE SHOULDER ARTHROPLASTY;  Surgeon: Tania Ade, MD;  Location: [facility];  Service: Orthopedics;  Laterality: Right;  Right reverse total shoulder arthroplasty   ROBOTIC ASSISTED SUPRACERVICAL HYSTERECTOMY WITH BILATERAL SALPINGO OOPHERECTOMY  01/30/15   by Dr. Polly Cobia at [facility]    There were no vitals filed for this visit.    Subjective Assessment - 12/28/20 1246     Subjective Pt states she took the bandages off on Saturday, when therapist questioned why she took them off pt stated that she thought that she was suppose to.    Pertinent History endometrial cancer, anxiety, graves dz, LT  leg swelling.    Patient Stated Goals less leg swelling                               [facility] Adult PT Treatment/Exercise - 12/28/20 0001       Manual Therapy   Manual Therapy Manual Lymphatic Drainage (MLD);Compression Bandaging    Manual therapy comments Manual complete separate than rest of tx    Manual Lymphatic Drainage (MLD) Manual lymphedema decongestive techniques for supraclavicular, deep and superficial abdominal, Lt inguinal  axillary anastomosis and ineringuinal anastomosis.  Completed both anterior and posterior    Compression Bandaging Multilayer short stretch bandages thigh high with 1/2in foam                      PT Short Term Goals - 12/25/20 1543       PT SHORT TERM GOAL #1   Title PT to have lost between 2-3  cm from measurements in Lt LE    Time 2    Period Weeks    Status On-going    Target Date 12/23/20      PT SHORT TERM GOAL #2   Title PT to state that her pain has decreased to no greater than a 2/10    Time 2    Period Weeks    Status Achieved               PT Long Term Goals - 12/25/20 1542       PT LONG TERM GOAL #1   Title PT to have lost 3-4 cm of fluid from LE where applicable.    Time 4    Period Weeks    Status On-going      PT LONG TERM GOAL #2   Title PT to verbalize that she would do best with thigh high compression garments, if this is not obtainable due to fit capri with knee highs should be worn daily    Time 4    Period Weeks    Status On-going      PT LONG TERM GOAL #3   Title PT to have obtained and be using a compression pump to assist in decreasing edema and improving venous return.    Time 6    Period Weeks    Status  On-going                   Plan - 12/28/20 1247     Clinical Impression Statement Pt encouraged to keep compression bandaging on as long as possible as induration in posterior aspect of ankle has returned.    Personal Factors and Comorbidities Comorbidity 3+    Comorbidities CVI, endometrial cancer in the past with total hysterectomy, Graves DZ    Examination-Activity Limitations Locomotion Level;Sit;Stand    Examination-Participation Restrictions Cleaning;Community Activity    Stability/Clinical Decision Making Stable/Uncomplicated    Rehab Potential Good    PT Frequency 3x / week    PT Duration 4 weeks    PT Treatment/Interventions Therapeutic exercise;Manual techniques;Manual lymph drainage;Patient/family education    PT Next Visit Plan . continue decongestive techniques for Lt LE, multilayer short stretch bandages to thigh. Measure on Fridays    PT Home Exercise Plan ankle pumps, LAQ, hip ab/adduction, marching, diaphragmic breathing and lymphatic squeeze.             Patient will benefit from skilled therapeutic intervention in order to improve the following deficits and impairments:  Decreased skin integrity, Increased edema, Pain, Decreased mobility, Difficulty walking  Visit Diagnosis: Pain in left leg  Lymphedema, not elsewhere classified     Problem List Patient Active Problem List   Diagnosis Date Noted   S/p reverse total shoulder arthroplasty 12/14/2015   Endometrial cancer [facility]) 04/08/2015   [name], [name] [phone]  12/28/2020, 12:49 PM  [facility] [address] [place], [place], [address] Phone: [phone]   Fax:  [phone]  Name: Kendra Fuentes MRN: [id number] Date of Birth: 12-27-1950

## 2020-12-29 ENCOUNTER — Ambulatory Visit (HOSPITAL_COMMUNITY): Payer: Medicare Other

## 2020-12-29 ENCOUNTER — Encounter (HOSPITAL_COMMUNITY): Payer: Self-pay

## 2020-12-29 DIAGNOSIS — I890 Lymphedema, not elsewhere classified: Secondary | ICD-10-CM

## 2020-12-29 DIAGNOSIS — M79605 Pain in left leg: Secondary | ICD-10-CM

## 2020-12-29 NOTE — Therapy (Signed)
[facility] [place], [place], [address] Phone: [phone]   Fax:  [phone]  Physical Therapy Treatment  Patient Details  Name: Kendra Fuentes MRN: [id number] Date of Birth: [birth year] Referring Provider (PT): Charolette Child   Encounter Date: 12/29/2020   PT End of Session - 12/29/20 0919     Visit Number 6    Number of Visits 12    Date for PT Re-Evaluation 01/13/21    Authorization Type UHC Medicare    Progress Note Due on Visit 10    PT Start Time 0828    PT Stop Time 0915    PT Time Calculation (min) 47 min    Activity Tolerance Patient tolerated treatment well    Behavior During Therapy [facility] for tasks assessed/performed             Past Medical History:  Diagnosis Date   Anxiety    Arthritis    Borderline diabetes    Depression    Endometrial cancer ([place])    GERD (gastroesophageal reflux disease)    Graves disease    Graves disease    radiatiated   Heart murmur    Leg swelling    left ankle   Light-headed feeling    "I eat something then I'm okay"   Radiation 04/16/15, 04/21/15, 04/23/15, 04/28/15, 04/30/15   vaginal cuff 30 gray   Urgency of urination     Past Surgical History:  Procedure Laterality Date   CHOLECYSTECTOMY     COLONOSCOPY     KNEE SURGERY Left    LAPAROSCOPIC PELVIC LYMPH NODE BIOPSY  01/30/15   by Dr. Polly Cobia at [place] ([facility]) DISTAL RADIAL FRACTURE Left 09/23/2013   Procedure: OPEN TREATMENT OF LEFT DISTAL RADIUS AND SCAPHOID FRACTURES;  Surgeon: Jolyn Nap, MD;  Location: [facility];  Service: Orthopedics;  Laterality: Left;   REVERSE SHOULDER ARTHROPLASTY Right 12/14/2015   Procedure: REVERSE SHOULDER ARTHROPLASTY;  Surgeon: Tania Ade, MD;  Location: [facility] [place];  Service: Orthopedics;  Laterality: Right;  Right reverse total shoulder arthroplasty   ROBOTIC ASSISTED SUPRACERVICAL HYSTERECTOMY WITH BILATERAL SALPINGO  OOPHERECTOMY  01/30/15   by Dr. Polly Cobia at [facility]    There were no vitals filed for this visit.   Subjective Assessment - 12/29/20 0917     Subjective Pt stated she feels discouraged today, stated ankle and leg feel tight today.  Wore bandages wil 5:00 this morning.    Pertinent History endometrial cancer, anxiety, graves dz, LT  leg swelling.    Patient Stated Goals less leg swelling    Currently in Pain? No/denies    Pain Descriptors / Indicators Discomfort                               [facility] Adult PT Treatment/Exercise - 12/29/20 0001       Manual Therapy   Manual Therapy Manual Lymphatic Drainage (MLD);Compression Bandaging    Manual therapy comments Manual complete separate than rest of tx    Manual Lymphatic Drainage (MLD) Manual lymphedema decongestive techniques for supraclavicular, deep and superficial abdominal, Lt inguinal  axillary anastomosis and ineringuinal anastomosis.  Completed both anterior and posterior    Compression Bandaging Multilayer short stretch bandages thigh high with 1/2in foam  PT Short Term Goals - 12/25/20 1543       PT SHORT TERM GOAL #1   Title PT to have lost between 2-3 cm from measurements in Lt LE    Time 2    Period Weeks    Status On-going    Target Date 12/23/20      PT SHORT TERM GOAL #2   Title PT to state that her pain has decreased to no greater than a 2/10    Time 2    Period Weeks    Status Achieved               PT Long Term Goals - 12/25/20 1542       PT LONG TERM GOAL #1   Title PT to have lost 3-4 cm of fluid from LE where applicable.    Time 4    Period Weeks    Status On-going      PT LONG TERM GOAL #2   Title PT to verbalize that she would do best with thigh high compression garments, if this is not obtainable due to fit capri with knee highs should be worn daily    Time 4    Period Weeks    Status On-going      PT LONG TERM GOAL #3   Title  PT to have obtained and be using a compression pump to assist in decreasing edema and improving venous return.    Time 6    Period Weeks    Status On-going                   Plan - 12/29/20 1025     Clinical Impression Statement Noted increased induration anterior shin and posterior aspect of ankle.  Reviewed wearing time wiht compression bandages, pt reports she wore as long as she could before need to take off and re-roll before apt.    Personal Factors and Comorbidities Comorbidity 3+    Comorbidities CVI, endometrial cancer in the past with total hysterectomy, Graves DZ    Examination-Activity Limitations Locomotion Level;Sit;Stand    Examination-Participation Restrictions Cleaning;Community Activity    Stability/Clinical Decision Making Stable/Uncomplicated    Clinical Decision Making Moderate    Rehab Potential Good    PT Frequency 3x / week    PT Duration 4 weeks    PT Treatment/Interventions Therapeutic exercise;Manual techniques;Manual lymph drainage;Patient/family education    PT Next Visit Plan . continue decongestive techniques for Lt LE, multilayer short stretch bandages to thigh. Measure on Fridays    PT Home Exercise Plan ankle pumps, LAQ, hip ab/adduction, marching, diaphragmic breathing and lymphatic squeeze.    Consulted and Agree with Plan of Care Patient             Patient will benefit from skilled therapeutic intervention in order to improve the following deficits and impairments:  Decreased skin integrity, Increased edema, Pain, Decreased mobility, Difficulty walking  Visit Diagnosis: Pain in left leg  Lymphedema, not elsewhere classified     Problem List Patient Active Problem List   Diagnosis Date Noted   S/p reverse total shoulder arthroplasty 12/14/2015   Endometrial cancer ([place]) 04/08/2015   Ihor Austin, LPTA/CLT; CBIS [phone]  Aldona Lento 12/29/2020, 10:29 AM  [facility] [place], [place], [address] Phone: [phone]   Fax:  [phone]  Name: Kendra Fuentes MRN: [id number] Date of Birth: [birth year]

## 2020-12-30 ENCOUNTER — Encounter (HOSPITAL_COMMUNITY): Payer: Medicare Other | Admitting: Physical Therapy

## 2021-01-01 ENCOUNTER — Ambulatory Visit (HOSPITAL_COMMUNITY): Payer: Medicare Other | Admitting: Physical Therapy

## 2021-01-01 ENCOUNTER — Other Ambulatory Visit: Payer: Self-pay

## 2021-01-01 DIAGNOSIS — M79605 Pain in left leg: Secondary | ICD-10-CM

## 2021-01-01 DIAGNOSIS — I890 Lymphedema, not elsewhere classified: Secondary | ICD-10-CM | POA: Diagnosis not present

## 2021-01-01 NOTE — Therapy (Signed)
[place], [place], [address] Phone: [phone]   Fax:  [phone]  Physical Therapy Treatment  Patient Details  Name: Kendra Fuentes MRN: [id number] Date of Birth: 08-29-1950 Referring Provider (PT): Charolette Child   Encounter Date: 01/01/2021   PT End of Session - 01/01/21 1220     Visit Number 7    Number of Visits 12    Date for PT Re-Evaluation 01/13/21    Authorization Type UHC Medicare    Progress Note Due on Visit 10    PT Start Time 1130    PT Stop Time 1215    PT Time Calculation (min) 45 min    Activity Tolerance Patient tolerated treatment well    Behavior During Therapy [facility] for tasks assessed/performed             Past Medical History:  Diagnosis Date   Anxiety    Arthritis    Borderline diabetes    Depression    Endometrial cancer ([place])    GERD (gastroesophageal reflux disease)    Graves disease    Graves disease    radiatiated   Heart murmur    Leg swelling    left ankle   Light-headed feeling    "I eat something then I'm okay"   Radiation 04/16/15, 04/21/15, 04/23/15, 04/28/15, 04/30/15   vaginal cuff 30 gray   Urgency of urination     Past Surgical History:  Procedure Laterality Date   CHOLECYSTECTOMY     COLONOSCOPY     KNEE SURGERY Left    LAPAROSCOPIC PELVIC LYMPH NODE BIOPSY  01/30/15   by Dr. Polly Cobia at [place] ([facility]) DISTAL RADIAL FRACTURE Left 09/23/2013   Procedure: OPEN TREATMENT OF LEFT DISTAL RADIUS AND SCAPHOID FRACTURES;  Surgeon: Jolyn Nap, MD;  Location: [facility];  Service: Orthopedics;  Laterality: Left;   REVERSE SHOULDER ARTHROPLASTY Right 12/14/2015   Procedure: REVERSE SHOULDER ARTHROPLASTY;  Surgeon: Tania Ade, MD;  Location: [facility];  Service: Orthopedics;  Laterality: Right;  Right reverse total shoulder arthroplasty   ROBOTIC ASSISTED SUPRACERVICAL HYSTERECTOMY WITH BILATERAL SALPINGO  OOPHERECTOMY  01/30/15   by Dr. Polly Cobia at [facility]    There were no vitals filed for this visit.   Subjective Assessment - 01/01/21 1220     Subjective Pt has no complaint.    Pertinent History endometrial cancer, anxiety, graves dz, LT  leg swelling.    Patient Stated Goals less leg swelling    Currently in Pain? No/denies                   LYMPHEDEMA/ONCOLOGY QUESTIONNAIRE - 01/01/21 0001       Left Lower Extremity Lymphedema   20 cm Proximal to Suprapatella 75 cm    10 cm Proximal to Suprapatella 64.5 cm    At Midpatella/Popliteal Crease 49 cm    30 cm Proximal to Floor at Lateral Plantar Foot 46 cm    20 cm Proximal to Floor at Lateral Plantar Foot 37 cm    10 cm Proximal to Floor at Lateral Malleoli 26.3 cm    Circumference of ankle/heel 31.3 cm.    5 cm Proximal to 1st MTP Joint 21.3 cm    Across MTP Joint 21 cm                        [facility] Adult PT Treatment/Exercise -  01/01/21 0001       Manual Therapy   Manual Therapy Manual Lymphatic Drainage (MLD);Compression Bandaging    Manual therapy comments Manual complete separate than rest of tx    Manual Lymphatic Drainage (MLD) Manual lymphedema decongestive techniques for supraclavicular, deep and superficial abdominal, Lt inguinal  axillary anastomosis and ineringuinal anastomosis.  Completed both anterior and posterior    Compression Bandaging Multilayer short stretch bandages thigh high with 1/2in foam                      PT Short Term Goals - 12/25/20 1543       PT SHORT TERM GOAL #1   Title PT to have lost between 2-3 cm from measurements in Lt LE    Time 2    Period Weeks    Status On-going    Target Date 12/23/20      PT SHORT TERM GOAL #2   Title PT to state that her pain has decreased to no greater than a 2/10    Time 2    Period Weeks    Status Achieved               PT Long Term Goals - 12/25/20 1542       PT LONG TERM GOAL #1   Title PT to have  lost 3-4 cm of fluid from LE where applicable.    Time 4    Period Weeks    Status On-going      PT LONG TERM GOAL #2   Title PT to verbalize that she would do best with thigh high compression garments, if this is not obtainable due to fit capri with knee highs should be worn daily    Time 4    Period Weeks    Status On-going      PT LONG TERM GOAL #3   Title PT to have obtained and be using a compression pump to assist in decreasing edema and improving venous return.    Time 6    Period Weeks    Status On-going                   Plan - 01/01/21 1221     Clinical Impression Statement PT remeasured.  Decreased volume from knee down, thigh is slightly up.  Pt continues to do her exercises and is pumping when out of the compression bandages.    Personal Factors and Comorbidities Comorbidity 3+    Comorbidities CVI, endometrial cancer in the past with total hysterectomy, Graves DZ    Examination-Activity Limitations Locomotion Level;Sit;Stand    Examination-Participation Restrictions Cleaning;Community Activity    Stability/Clinical Decision Making Stable/Uncomplicated    Rehab Potential Good    PT Frequency 3x / week    PT Duration 4 weeks    PT Treatment/Interventions Therapeutic exercise;Manual techniques;Manual lymph drainage;Patient/family education    PT Next Visit Plan . continue decongestive techniques for Lt LE, multilayer short stretch bandages to thigh. Measure on Fridays    PT Home Exercise Plan ankle pumps, LAQ, hip ab/adduction, marching, diaphragmic breathing and lymphatic squeeze.    Consulted and Agree with Plan of Care Patient             Patient will benefit from skilled therapeutic intervention in order to improve the following deficits and impairments:  Decreased skin integrity, Increased edema, Pain, Decreased mobility, Difficulty walking  Visit Diagnosis: Pain in left leg  Lymphedema, not elsewhere classified  Problem List Patient  Active Problem List   Diagnosis Date Noted   S/p reverse total shoulder arthroplasty 12/14/2015   Endometrial cancer [facility]) 04/08/2015   Kendra Fuentes, [name] [phone]  01/01/2021, 12:23 PM  [facility] [address] [place], [place], [address] Phone: [phone]   Fax:  [phone]  Name: Kendra Fuentes MRN: [id number] Date of Birth: [birth year]

## 2021-01-04 ENCOUNTER — Other Ambulatory Visit: Payer: Self-pay

## 2021-01-04 ENCOUNTER — Ambulatory Visit (HOSPITAL_COMMUNITY): Payer: Medicare Other | Admitting: Physical Therapy

## 2021-01-04 DIAGNOSIS — I890 Lymphedema, not elsewhere classified: Secondary | ICD-10-CM

## 2021-01-04 DIAGNOSIS — M79605 Pain in left leg: Secondary | ICD-10-CM

## 2021-01-04 NOTE — Therapy (Signed)
[facility] [address] [place], [place], [address] Phone: [phone]   Fax:  [phone]  Physical Therapy Treatment  Patient Details  Name: Kendra Fuentes MRN: [id number] Date of Birth: 09/14/1950 Referring Provider (PT): Charolette Child   Encounter Date: 01/04/2021   PT End of Session - 01/04/21 1219     Visit Number 8    Number of Visits 12    Date for PT Re-Evaluation 01/13/21    Authorization Type UHC Medicare    Progress Note Due on Visit 10    PT Start Time 0836    PT Stop Time 0918    PT Time Calculation (min) 42 min    Activity Tolerance Patient tolerated treatment well    Behavior During Therapy [facility] for tasks assessed/performed             Past Medical History:  Diagnosis Date   Anxiety    Arthritis    Borderline diabetes    Depression    Endometrial cancer ([place])    GERD (gastroesophageal reflux disease)    Graves disease    Graves disease    radiatiated   Heart murmur    Leg swelling    left ankle   Light-headed feeling    "I eat something then I'm okay"   Radiation 04/16/15, 04/21/15, 04/23/15, 04/28/15, 04/30/15   vaginal cuff 30 gray   Urgency of urination     Past Surgical History:  Procedure Laterality Date   CHOLECYSTECTOMY     COLONOSCOPY     KNEE SURGERY Left    LAPAROSCOPIC PELVIC LYMPH NODE BIOPSY  01/30/15   by Dr. Polly Cobia at [place] ([facility]) DISTAL RADIAL FRACTURE Left 09/23/2013   Procedure: OPEN TREATMENT OF LEFT DISTAL RADIUS AND SCAPHOID FRACTURES;  Surgeon: Jolyn Nap, MD;  Location: [facility];  Service: Orthopedics;  Laterality: Left;   REVERSE SHOULDER ARTHROPLASTY Right 12/14/2015   Procedure: REVERSE SHOULDER ARTHROPLASTY;  Surgeon: Tania Ade, MD;  Location: [facility];  Service: Orthopedics;  Laterality: Right;  Right reverse total shoulder arthroplasty   ROBOTIC ASSISTED SUPRACERVICAL HYSTERECTOMY WITH BILATERAL SALPINGO  OOPHERECTOMY  01/30/15   by Dr. Polly [facility] at [facility]    There were no vitals filed for this visit.   Subjective Assessment - 01/04/21 1214     Subjective pt states she is doing well; done alot of skating lessons over the weekend.  Reports she removed her bandages Saturday morning.    Currently in Pain? No/denies                               [facility] Adult PT Treatment/Exercise - 01/04/21 0001       Manual Therapy   Manual Therapy Manual Lymphatic Drainage (MLD);Compression Bandaging    Manual therapy comments Manual complete separate than rest of tx    Manual Lymphatic Drainage (MLD) Manual lymphedema decongestive techniques for supraclavicular, deep and superficial abdominal, Lt inguinal  axillary anastomosis and intrainguinal anastomosis.  Completed both anterior and posterior    Compression Bandaging Multilayer short stretch bandages thigh high with 1/2in foam                      PT Short Term Goals - 12/25/20 1543       PT SHORT TERM GOAL #1   Title PT to have lost between 2-3 cm from  measurements in Lt LE    Time 2    Period Weeks    Status On-going    Target Date 12/23/20      PT SHORT TERM GOAL #2   Title PT to state that her pain has decreased to no greater than a 2/10    Time 2    Period Weeks    Status Achieved               PT Long Term Goals - 12/25/20 1542       PT LONG TERM GOAL #1   Title PT to have lost 3-4 cm of fluid from LE where applicable.    Time 4    Period Weeks    Status On-going      PT LONG TERM GOAL #2   Title PT to verbalize that she would do best with thigh high compression garments, if this is not obtainable due to fit capri with knee highs should be worn daily    Time 4    Period Weeks    Status On-going      PT LONG TERM GOAL #3   Title PT to have obtained and be using a compression pump to assist in decreasing edema and improving venous return.    Time 6    Period Weeks    Status  On-going                   Plan - 01/04/21 1218     Clinical Impression Statement Continued with manual lymph drainage and full LE bandaging for Lt LE.  Discussed garments and informed of Elastic therapy which Is a much cheaper option.  Educated to use compression stockings when she removes her bandaging for better results as well as  these need to be replaced every 6 months.  Demonstrated the sock butler as she feels she may need a donner.  Pt reported overall comfort with bandaging today.  No induration present in thigh and only in distal LE and around posteriorly.    Personal Factors and Comorbidities Comorbidity 3+    Comorbidities CVI, endometrial cancer in the past with total hysterectomy, Graves DZ    Examination-Activity Limitations Locomotion Level;Sit;Stand    Examination-Participation Restrictions Cleaning;Community Activity    Stability/Clinical Decision Making Stable/Uncomplicated    Rehab Potential Good    PT Frequency 3x / week    PT Duration 4 weeks    PT Treatment/Interventions Therapeutic exercise;Manual techniques;Manual lymph drainage;Patient/family education    PT Next Visit Plan Continue decongestive techniques for Lt LE, multilayer short stretch bandages to thigh. Measure on Fridays.  Give pamphlet for Elastic Therapy next session.    PT Home Exercise Plan ankle pumps, LAQ, hip ab/adduction, marching, diaphragmic breathing and lymphatic squeeze.    Consulted and Agree with Plan of Care Patient             Patient will benefit from skilled therapeutic intervention in order to improve the following deficits and impairments:  Decreased skin integrity, Increased edema, Pain, Decreased mobility, Difficulty walking  Visit Diagnosis: Lymphedema, not elsewhere classified  Pain in left leg     Problem List Patient Active Problem List   Diagnosis Date Noted   S/p reverse total shoulder arthroplasty 12/14/2015   Endometrial cancer ([place]) 04/08/2015   Teena Irani, PTA/CLT [phone]  Teena Irani 01/04/2021, 12:21 PM  [facility] [place], [place], [address] Phone: [phone]  Fax:  [phone]  Name: Kendra Fuentes MRN: [id number] Date of Birth: 1950-08-15

## 2021-01-06 ENCOUNTER — Other Ambulatory Visit: Payer: Self-pay

## 2021-01-06 ENCOUNTER — Ambulatory Visit (HOSPITAL_COMMUNITY): Payer: Medicare Other | Admitting: Physical Therapy

## 2021-01-06 DIAGNOSIS — M79605 Pain in left leg: Secondary | ICD-10-CM

## 2021-01-06 DIAGNOSIS — I890 Lymphedema, not elsewhere classified: Secondary | ICD-10-CM | POA: Diagnosis not present

## 2021-01-06 NOTE — Therapy (Signed)
[facility] [place], [place], [address] Phone: [phone]   Fax:  [phone]  Physical Therapy Treatment  Patient Details  Name: Kendra Fuentes MRN: [id number] Date of Birth: [birth year] Referring Provider (PT): Charolette Child   Encounter Date: 01/06/2021   PT End of Session - 01/06/21 [id number]     Visit Number 9    Number of Visits 12    Date for PT Re-Evaluation 01/13/21    Authorization Type UHC Medicare    Progress Note Due on Visit 10    PT Start Time 0830    PT Stop Time 0915    PT Time Calculation (min) 45 min    Activity Tolerance Patient tolerated treatment well    Behavior During Therapy [facility] for tasks assessed/performed             Past Medical History:  Diagnosis Date   Anxiety    Arthritis    Borderline diabetes    Depression    Endometrial cancer ([place])    GERD (gastroesophageal reflux disease)    Graves disease    Graves disease    radiatiated   Heart murmur    Leg swelling    left ankle   Light-headed feeling    "I eat something then I'm okay"   Radiation 04/16/15, 04/21/15, 04/23/15, 04/28/15, 04/30/15   vaginal cuff 30 gray   Urgency of urination     Past Surgical History:  Procedure Laterality Date   CHOLECYSTECTOMY     COLONOSCOPY     KNEE SURGERY Left    LAPAROSCOPIC PELVIC LYMPH NODE BIOPSY  01/30/15   by Dr. Polly Cobia at [place] ([facility]) DISTAL RADIAL FRACTURE Left 09/23/2013   Procedure: OPEN TREATMENT OF LEFT DISTAL RADIUS AND SCAPHOID FRACTURES;  Surgeon: Jolyn Nap, MD;  Location: [facility];  Service: Orthopedics;  Laterality: Left;   REVERSE SHOULDER ARTHROPLASTY Right 12/14/2015   Procedure: REVERSE SHOULDER ARTHROPLASTY;  Surgeon: Tania Ade, MD;  Location: [facility];  Service: Orthopedics;  Laterality: Right;  Right reverse total shoulder arthroplasty   ROBOTIC ASSISTED SUPRACERVICAL HYSTERECTOMY WITH BILATERAL SALPINGO  OOPHERECTOMY  01/30/15   by Dr. Polly Cobia at [facility]    There were no vitals filed for this visit.   Subjective Assessment - 01/06/21 0821     Subjective Pt has no complaint.    Pertinent History endometrial cancer, anxiety, graves dz, LT  leg swelling.    Patient Stated Goals less leg swelling    Currently in Pain? No/denies                               [facility] Adult PT Treatment/Exercise - 01/06/21 0001       Manual Therapy   Manual Therapy Manual Lymphatic Drainage (MLD);Compression Bandaging    Manual therapy comments Manual complete separate than rest of tx    Manual Lymphatic Drainage (MLD) Manual lymphedema decongestive techniques for supraclavicular, deep and superficial abdominal, Lt inguinal  axillary anastomosis and intrainguinal anastomosis.  Completed both anterior and posterior    Compression Bandaging Multilayer short stretch bandages thigh high with 1/2in foam                      PT Short Term Goals - 12/25/20 1543       PT SHORT TERM GOAL #1   Title PT to have  lost between 2-3 cm from measurements in Lt LE    Time 2    Period Weeks    Status On-going    Target Date 12/23/20      PT SHORT TERM GOAL #2   Title PT to state that her pain has decreased to no greater than a 2/10    Time 2    Period Weeks    Status Achieved               PT Long Term Goals - 12/25/20 1542       PT LONG TERM GOAL #1   Title PT to have lost 3-4 cm of fluid from LE where applicable.    Time 4    Period Weeks    Status On-going      PT LONG TERM GOAL #2   Title PT to verbalize that she would do best with thigh high compression garments, if this is not obtainable due to fit capri with knee highs should be worn daily    Time 4    Period Weeks    Status On-going      PT LONG TERM GOAL #3   Title PT to have obtained and be using a compression pump to assist in decreasing edema and improving venous return.    Time 6    Period Weeks     Status On-going                   Plan - 01/06/21 [id number]     Clinical Impression Statement Pt appears to have slight increased induration along upper lateral thigh .  Continued induration behind pt's ankle.  Pt given brochure for Elastic therapy.    Personal Factors and Comorbidities Comorbidity 3+    Comorbidities CVI, endometrial cancer in the past with total hysterectomy, Graves DZ    Examination-Activity Limitations Locomotion Level;Sit;Stand    Examination-Participation Restrictions Cleaning;Community Activity    Stability/Clinical Decision Making Stable/Uncomplicated    Rehab Potential Good    PT Frequency 3x / week    PT Duration 4 weeks    PT Treatment/Interventions Therapeutic exercise;Manual techniques;Manual lymph drainage;Patient/family education    PT Next Visit Plan Continue decongestive techniques for Lt LE, multilayer short stretch bandages to thigh. Measure on Fridays.    PT Home Exercise Plan ankle pumps, LAQ, hip ab/adduction, marching, diaphragmic breathing and lymphatic squeeze.    Consulted and Agree with Plan of Care Patient             Patient will benefit from skilled therapeutic intervention in order to improve the following deficits and impairments:  Decreased skin integrity, Increased edema, Pain, Decreased mobility, Difficulty walking  Visit Diagnosis: Lymphedema, not elsewhere classified  Pain in left leg     Problem List Patient Active Problem List   Diagnosis Date Noted   S/p reverse total shoulder arthroplasty 12/14/2015   Endometrial cancer [facility]) 04/08/2015   [name], [name] [phone]  01/06/2021, 10:32 AM  [facility] [address] [place], [place], [address] Phone: [phone]   Fax:  [phone]  Name: Kendra Fuentes MRN: [id number] Date of Birth: 1951/02/03

## 2021-01-08 ENCOUNTER — Other Ambulatory Visit: Payer: Self-pay

## 2021-01-08 ENCOUNTER — Ambulatory Visit (HOSPITAL_COMMUNITY): Payer: Medicare Other | Attending: Family Medicine | Admitting: Physical Therapy

## 2021-01-08 DIAGNOSIS — I890 Lymphedema, not elsewhere classified: Secondary | ICD-10-CM | POA: Insufficient documentation

## 2021-01-08 DIAGNOSIS — M79605 Pain in left leg: Secondary | ICD-10-CM | POA: Diagnosis present

## 2021-01-09 NOTE — Therapy (Signed)
[place], [place], [address] Phone: [phone]   Fax:  [phone]  Physical Therapy Treatment  Patient Details  Name: Kendra Fuentes MRN: [id number] Date of Birth: Mar 15, 1951 Referring Provider (PT): Charolette Child   Encounter Date: 01/08/2021   PT End of Session - 01/09/21 1226     Visit Number 10    Number of Visits 12    Date for PT Re-Evaluation 01/13/21    Authorization Type UHC Medicare    Progress Note Due on Visit 12    PT Start Time 1315    PT Stop Time 1400    PT Time Calculation (min) 45 min    Activity Tolerance Patient tolerated treatment well    Behavior During Therapy [facility] for tasks assessed/performed             Past Medical History:  Diagnosis Date   Anxiety    Arthritis    Borderline diabetes    Depression    Endometrial cancer ([place])    GERD (gastroesophageal reflux disease)    Graves disease    Graves disease    radiatiated   Heart murmur    Leg swelling    left ankle   Light-headed feeling    "I eat something then I'm okay"   Radiation 04/16/15, 04/21/15, 04/23/15, 04/28/15, 04/30/15   vaginal cuff 30 gray   Urgency of urination     Past Surgical History:  Procedure Laterality Date   CHOLECYSTECTOMY     COLONOSCOPY     KNEE SURGERY Left    LAPAROSCOPIC PELVIC LYMPH NODE BIOPSY  01/30/15   by Dr. Polly Cobia at [place] ([facility]) DISTAL RADIAL FRACTURE Left 09/23/2013   Procedure: OPEN TREATMENT OF LEFT DISTAL RADIUS AND SCAPHOID FRACTURES;  Surgeon: Jolyn Nap, MD;  Location: [facility];  Service: Orthopedics;  Laterality: Left;   REVERSE SHOULDER ARTHROPLASTY Right 12/14/2015   Procedure: REVERSE SHOULDER ARTHROPLASTY;  Surgeon: Tania Ade, MD;  Location: [facility];  Service: Orthopedics;  Laterality: Right;  Right reverse total shoulder arthroplasty   ROBOTIC ASSISTED SUPRACERVICAL HYSTERECTOMY WITH BILATERAL SALPINGO  OOPHERECTOMY  01/30/15   by Dr. Polly C[facility] at [facility]    There were no vitals filed for this visit.   Subjective Assessment - 01/08/21 1323     Subjective Pt has no complaint.    Pertinent History endometrial cancer, anxiety, graves dz, LT  leg swelling.    Patient Stated Goals less leg swelling                  01/08/21 0001  Lymphedema Assessments  Lymphedema Assessments LE  Left Lower Extremity Lymphedema  20 cm Proximal to Suprapatella 75.5 cm (was 75.5)  10 cm Proximal to Suprapatella 65.4 cm (was 64.4)  At Midpatella/Popliteal Crease 50 cm (was50)  30 cm Proximal to Floor at Lateral Plantar Foot 47.5 cm (was 49)  20 cm Proximal to Floor at Lateral Plantar Foot 37.3 cm (was 37.3)  10 cm Proximal to Floor at Lateral Malleoli 26.5 cm (was 28.2)  Circumference of ankle/heel 31.3 cm. (was 32.3)  5 cm Proximal to 1st MTP Joint 21.5 cm (was 23.2)  Across MTP Joint 21 cm (was 21.9)                 [facility] Adult PT Treatment/Exercise - 01/09/21 0001       Manual Therapy   Manual Therapy Manual Lymphatic  Drainage (MLD);Compression Bandaging    Manual therapy comments Manual complete separate than rest of tx    Manual Lymphatic Drainage (MLD) Manual lymphedema decongestive techniques for supraclavicular, deep and superficial abdominal, Lt inguinal  axillary anastomosis and intrainguinal anastomosis.  Completed both anterior and posterior    Compression Bandaging Multilayer short stretch bandages thigh high with 1/2in foam                    PT Education - 01/09/21 1223     Education Details self massage    Person(s) Educated Patient    Methods Explanation;Verbal cues;Handout;Demonstration    Comprehension Verbalized understanding;Returned demonstration              PT Short Term Goals - 01/09/21 1229       PT SHORT TERM GOAL #1   Title PT to have lost between 2-3 cm from measurements in Lt LE    Time 2    Period Weeks     Status On-going      PT SHORT TERM GOAL #2   Title PT to state that her pain has decreased to no greater than a 2/10    Time 2    Period Weeks    Status Achieved               PT Long Term Goals - 01/09/21 1229       PT LONG TERM GOAL #1   Title PT to have lost 3-4 cm of fluid from LE where applicable.    Period Weeks    Status On-going      PT LONG TERM GOAL #2   Title PT to verbalize that she would do best with thigh high compression garments, if this is not obtainable due to fit capri with knee highs should be worn daily    Time 4    Period Weeks    Status On-going      PT LONG TERM GOAL #3   Title PT to have obtained and be using a compression pump to assist in decreasing edema and improving venous return.    Time 6    Period Weeks    Status Achieved                   Plan - 01/09/21 1226     Clinical Impression Statement Pt admits to not being able to tolerate keeping compression bandaging on due to claustrophobia.  Therapist recommended getting the compression garment as pt feels that she could tolerate them.  Therapist instructed pt is self massage.  She is to retun next week with any questions.    Personal Factors and Comorbidities Comorbidity 3+    Comorbidities CVI, endometrial cancer in the past with total hysterectomy, Graves DZ    Examination-Activity Limitations Locomotion Level;Sit;Stand    Examination-Participation Restrictions Cleaning;Community Activity    Stability/Clinical Decision Making Stable/Uncomplicated    Rehab Potential Good    PT Frequency 3x / week    PT Duration 4 weeks    PT Treatment/Interventions Therapeutic exercise;Manual techniques;Manual lymph drainage;Patient/family education    PT Next Visit Plan Answer any questions on self massage, see if pt has obtained compression garment. Continue decongestive techniques for Lt LE, multilayer short stretch bandages to thigh. Measure on Fridays.    PT Home Exercise Plan ankle  pumps, LAQ, hip ab/adduction, marching, diaphragmic breathing and lymphatic squeeze.    Consulted and Agree with Plan of Care Patient  Patient will benefit from skilled therapeutic intervention in order to improve the following deficits and impairments:  Decreased skin integrity, Increased edema, Pain, Decreased mobility, Difficulty walking  Visit Diagnosis: Lymphedema, not elsewhere classified  Pain in left leg     Problem List Patient Active Problem List   Diagnosis Date Noted   S/p reverse total shoulder arthroplasty 12/14/2015   Endometrial cancer [facility]) 04/08/2015   [name], [name] [phone]  01/09/2021, 12:31 PM  [facility] [address] [place], [place], [address] Phone: [phone]   Fax:  [phone]  Name: SOLEI HEMMERLING MRN: [id number] Date of Birth: 27-Jun-1950

## 2021-01-13 ENCOUNTER — Other Ambulatory Visit: Payer: Self-pay

## 2021-01-13 ENCOUNTER — Ambulatory Visit (HOSPITAL_COMMUNITY): Payer: Medicare Other | Admitting: Physical Therapy

## 2021-01-13 DIAGNOSIS — I890 Lymphedema, not elsewhere classified: Secondary | ICD-10-CM

## 2021-01-13 NOTE — Therapy (Signed)
Kendra Fuentes, [place], [address] Phone: [phone]   Fax:  [phone]  Physical Therapy Treatment  Patient Details  Name: Kendra Fuentes MRN: [id number] Date of Birth: 1950-05-25 Referring Provider (PT): Charolette Child   Encounter Date: 01/13/2021   PT End of Session - 01/13/21 1050     Visit Number 11    Number of Visits 11    Date for PT Re-Evaluation 01/13/21    Authorization Type UHC Medicare    PT Start Time 0745    PT Stop Time 0835    PT Time Calculation (min) 50 min    Activity Tolerance Patient tolerated treatment well    Behavior During Therapy [facility] for tasks assessed/performed             Past Medical History:  Diagnosis Date   Anxiety    Arthritis    Borderline diabetes    Depression    Endometrial cancer ([place])    GERD (gastroesophageal reflux disease)    Graves disease    Graves disease    radiatiated   Heart murmur    Leg swelling    left ankle   Light-headed feeling    "I eat something then I'm okay"   Radiation 04/16/15, 04/21/15, 04/23/15, 04/28/15, 04/30/15   vaginal cuff 30 gray   Urgency of urination     Past Surgical History:  Procedure Laterality Date   CHOLECYSTECTOMY     COLONOSCOPY     KNEE SURGERY Left    LAPAROSCOPIC PELVIC LYMPH NODE BIOPSY  01/30/15   by Dr. Polly Cobia at [place] ([facility]) DISTAL RADIAL FRACTURE Left 09/23/2013   Procedure: OPEN TREATMENT OF LEFT DISTAL RADIUS AND SCAPHOID FRACTURES;  Surgeon: Jolyn Nap, MD;  Location: [place];  Service: Orthopedics;  Laterality: Left;   REVERSE SHOULDER ARTHROPLASTY Right 12/14/2015   Procedure: REVERSE SHOULDER ARTHROPLASTY;  Surgeon: Tania Ade, MD;  Location: [place];  Service: Orthopedics;  Laterality: Right;  Right reverse total shoulder arthroplasty   ROBOTIC ASSISTED SUPRACERVICAL HYSTERECTOMY WITH BILATERAL SALPINGO OOPHERECTOMY  01/30/15   by Dr.  Polly Cobia at [facility]    There were no vitals filed for this visit.   Subjective Assessment - 01/13/21 1048     Subjective PT states she was better at keeping the compression bandages in; she brought her compression garment in.    Pertinent History endometrial cancer, anxiety, graves dz, LT  leg swelling.    Patient Stated Goals less leg swelling    Currently in Pain? No/denies                   LYMPHEDEMA/ONCOLOGY QUESTIONNAIRE - 01/13/21 0001       Lymphedema Assessments   Lymphedema Assessments Lower extremities      Right Lower Extremity Lymphedema   20 cm Proximal to Suprapatella 71.5 cm    10 cm Proximal to Suprapatella 60.3 cm    At Midpatella/Popliteal Crease 46.3 cm    30 cm Proximal to Floor at Lateral Plantar Foot 48 cm    20 cm Proximal to Floor at Lateral Plantar Foot 37 1    10 cm Proximal to Floor at Lateral Malleoli 23.3 cm    Circumference of ankle/heel 31.8 cm.    5 cm Proximal to 1st MTP Joint 22.3 cm    Across MTP Joint 22 cm      Left Lower Extremity Lymphedema  20 cm Proximal to Suprapatella 75 cm   was 75.5   10 cm Proximal to Suprapatella 64.5 cm   was 64.4   At Midpatella/Popliteal Crease 50.2 cm   was 50   30 cm Proximal to Floor at Lateral Plantar Foot 47.3 cm   was 49   20 cm Proximal to Floor at Lateral Plantar Foot 36.7 cm   was 37.5   10 cm Proximal to Floor at Lateral Malleoli 25.6 cm   was 28.2   Circumference of ankle/heel 31.7 cm.   was 32.3   5 cm Proximal to 1st MTP Joint 22 cm   as 23.2   Across MTP Joint 21 cm   was 21.9                       [facility] Adult PT Treatment/Exercise - 01/13/21 0001       Manual Therapy   Manual Therapy Manual Lymphatic Drainage (MLD)    Manual therapy comments Manual complete separate than rest of tx    Manual Lymphatic Drainage (MLD) Manual lymphedema decongestive techniques for supraclavicular, deep and superficial abdominal, Lt inguinal  axillary anastomosis and intrainguinal  anastomosis.  Completed both anterior and posterior                  Upper Extremity Functional Index Score :   /80   PT Education - 01/13/21 1050     Education Details educated on self manual and donning garment using [employer].    Person(s) Educated Patient    Methods Explanation;Demonstration    Comprehension Verbalized understanding;Returned demonstration              PT Short Term Goals - 01/09/21 1229       PT SHORT TERM GOAL #1   Title PT to have lost between 2-3 cm from measurements in Lt LE    Time 2    Period Weeks    Status On-going      PT SHORT TERM GOAL #2   Title PT to state that her pain has decreased to no greater than a 2/10    Time 2    Period Weeks    Status Achieved               PT Long Term Goals - 01/09/21 1229       PT LONG TERM GOAL #1   Title PT to have lost 3-4 cm of fluid from LE where applicable.    Period Weeks    Status On-going      PT LONG TERM GOAL #2   Title PT to verbalize that she would do best with thigh high compression garments, if this is not obtainable due to fit capri with knee highs should be worn daily    Time 4    Period Weeks    Status Achieved      PT LONG TERM GOAL #3   Title PT to have obtained and be using a compression pump to assist in decreasing edema and improving venous return.    Time 6    Period Weeks    Status Achieved                   Plan - 01/13/21 1051     Clinical Impression Statement Therapist explained that she feel that the compression garment that she has is to long as well as being to big for her.  PT had questions on the  self manual that we went over last session therefore therapist reviewed self manual patient.  Therapist and pt discussed options.  PT would like to try controling sx of lymphedema at home with self massage, pumping and wearing compression garment.  If she is unable to do so she will call the clinic and reschedule appointment.  We will keep this open  for 3-4 weeks, if we do not hear back from the pt we will discharge.    Personal Factors and Comorbidities Comorbidity 3+    Comorbidities CVI, endometrial cancer in the past with total hysterectomy, Graves DZ    Examination-Activity Limitations Locomotion Level;Sit;Stand    Examination-Participation Restrictions Cleaning;Community Activity    Stability/Clinical Decision Making Stable/Uncomplicated    Rehab Potential Good    PT Frequency 3x / week    PT Duration 4 weeks    PT Treatment/Interventions Therapeutic exercise;Manual techniques;Manual lymph drainage;Patient/family education    PT Next Visit Plan Anticipate discharge on this visit.  If pt needs to return she will need a new cert.    PT Home Exercise Plan ankle pumps, LAQ, hip ab/adduction, marching, diaphragmic breathing and lymphatic squeeze.    Consulted and Agree with Plan of Care Patient             Patient will benefit from skilled therapeutic intervention in order to improve the following deficits and impairments:  Decreased skin integrity, Increased edema, Pain, Decreased mobility, Difficulty walking  Visit Diagnosis: Lymphedema, not elsewhere classified     Problem List Patient Active Problem List   Diagnosis Date Noted   S/p reverse total shoulder arthroplasty 12/14/2015   Endometrial cancer [facility]) 04/08/2015   [name], [name] [phone]  01/13/2021, 11:03 AM  [place], [place], [address] Phone: [phone]   Fax:  [phone]  Name: SARIN VERRETT MRN: [id number] Date of Birth: 1951-01-31

## 2021-02-09 ENCOUNTER — Ambulatory Visit: Payer: Medicare Other | Attending: Family Medicine | Admitting: Occupational Therapy

## 2021-02-09 ENCOUNTER — Other Ambulatory Visit: Payer: Self-pay

## 2021-02-09 DIAGNOSIS — M25511 Pain in right shoulder: Secondary | ICD-10-CM | POA: Insufficient documentation

## 2021-02-09 DIAGNOSIS — M6281 Muscle weakness (generalized): Secondary | ICD-10-CM | POA: Diagnosis present

## 2021-02-09 DIAGNOSIS — M25611 Stiffness of right shoulder, not elsewhere classified: Secondary | ICD-10-CM | POA: Diagnosis present

## 2021-02-09 NOTE — Therapy (Signed)
[facility] [address] [place], [place], [address] Phone: [phone]   Fax:  [phone]  Occupational Therapy Evaluation  Patient Details  Name: Kendra Fuentes MRN: [id number] Date of Birth: 10/18/1950 Referring Provider (OT): Dr. Shelly Bombard   Encounter Date: 02/09/2021   OT End of Session - 02/09/21 1130     Visit Number 1    Number of Visits 13    Date for OT Re-Evaluation 04/07/21    Authorization Type UHC MCR    Authorization Time Period 1    Authorization - Visit Number 10    Authorization - Number of Visits 10    OT Start Time 0850    OT Stop Time 0930    OT Time Calculation (min) 40 min    Activity Tolerance Patient tolerated treatment well    Behavior During Therapy [facility] for tasks assessed/performed             Past Medical History:  Diagnosis Date   Anxiety    Arthritis    Borderline diabetes    Depression    Endometrial cancer ([place])    GERD (gastroesophageal reflux disease)    Graves disease    Graves disease    radiatiated   Heart murmur    Leg swelling    left ankle   Light-headed feeling    "I eat something then I'm okay"   Radiation 04/16/15, 04/21/15, 04/23/15, 04/28/15, 04/30/15   vaginal cuff 30 gray   Urgency of urination     Past Surgical History:  Procedure Laterality Date   CHOLECYSTECTOMY     COLONOSCOPY     KNEE SURGERY Left    LAPAROSCOPIC PELVIC LYMPH NODE BIOPSY  01/30/15   by Dr. Polly Cobia at [place] ([facility]) DISTAL RADIAL FRACTURE Left 09/23/2013   Procedure: OPEN TREATMENT OF LEFT DISTAL RADIUS AND SCAPHOID FRACTURES;  Surgeon: Jolyn Nap, MD;  Location: [facility];  Service: Orthopedics;  Laterality: Left;   REVERSE SHOULDER ARTHROPLASTY Right 12/14/2015   Procedure: REVERSE SHOULDER ARTHROPLASTY;  Surgeon: Tania Ade, MD;  Location: [facility];  Service: Orthopedics;  Laterality: Right;  Right reverse total shoulder  arthroplasty   ROBOTIC ASSISTED SUPRACERVICAL HYSTERECTOMY WITH BILATERAL SALPINGO OOPHERECTOMY  01/30/15   by Dr. Polly Cobia at [facility]    There were no vitals filed for this visit.   Subjective Assessment - 02/09/21 0900     Subjective  He put me on a muscle relaxer which has helped some and it hasn't locked up (re: Rt shoulder). 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 h/o lymphedema secondary to CA, [facility] Lt DRF 2015,    Limitations lymphedema Lt LE    Patient Stated Goals Decrease shoulder pain, increase motion/use and endurance of arm    Currently in Pain? Yes    Pain Score 3    up to 10/10 at times   Pain Location Shoulder    Pain Orientation Right    Pain Descriptors / Indicators Dull;Heaviness    Pain Type Acute pain    Pain Onset More than a month ago    Pain Frequency Intermittent    Aggravating Factors  certain movements    Pain Relieving Factors muscle relaxer  [facility] OT Assessment - 02/09/21 0001       Assessment   Medical Diagnosis acute pain of Rt shoulder    Referring Provider (OT) Dr. Shelly Bombard    Onset Date/Surgical Date --   approx 1 month ago per pt report (previous Rt shoulder reverse arthroplasty 12/2015, old clavicle fx)   Hand Dominance Left    Prior Therapy currently on hold for P.T. for lymphedema, previous P.T. for Rt shoulder after surgery in 2017      Precautions   Precautions Other (comment)    Precaution Comments lymphedema      Balance Screen   Has the patient fallen in the past 6 months No      Home  Environment   Bathroom Shower/Tub Tub/Shower unit;Curtain   grab bar   Additional Comments Pt lives w/ 68 y.o. lady and helps with dressing, does all cooking and cleaning. 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      ADL   Eating/Feeding Independent    Grooming Modified independent   difficulty holding blow dryer and styling hair   Upper Body Bathing Modified independent    Lower Body Bathing Independent    Upper Body Dressing Increased time    Lower Body Dressing Independent    Patent examiner -  Hygiene Independent    Tub/Shower Transfer Modified independent    ADL comments RUE fatigues quickly w/ overhead activities/ADLS, difficulty putting walker in car (for elderly lady pt cares for), however pt reports heaviness and decreased RUE endurance since surgery in 2017      IADL   Shopping Takes care of all shopping needs independently    Light Housekeeping --   Does fully   Meal Prep Plans, prepares and serves adequate meals independently   light cooking, eats out a lot   Programmer, applications own vehicle    Medication Management Is responsible for taking medication in correct dosages at correct time      Mobility   Mobility Status Comments independent      Written Expression   Dominant Hand Left    Handwriting --   denies change - old wrist fx     Vision - History   Baseline Vision Wears glasses only for reading      Observation/Other Assessments   Observations h/o Rt sh reverse arthroplasty 2017, old clavicle fx, OA bilateral hands, [facility] LT DRF fx 2015. Latest MD office notes states: no acute fx of humerus, and prosthesis apppears intact and anatomic. Pt denies any insult or recent fall to Rt shoulder and denies transferring lady she cares for, but does think sleeping on couch has impacted recent sh changes      Sensation   Light Touch Appears Intact      Coordination   Fine Motor Movements are Fluid and Coordinated Yes     Coordination WNL's      Edema   Edema none in UE's reported, Lt LE swelling d/t lymphedema      ROM / Strength   AROM / PROM / Strength AROM;Strength      Palpation   Palpation comment no abnormalities noted at [facility] joint      AROM  Overall AROM Comments scapula movement on Rt appears WFL's, Lt scapula elevation greater than Rt but Lt appears hypermobile w/ scapula elevation. LUE ROM WFL's however has had some pain along middle deltoid    AROM Assessment Site Shoulder    Right/Left Shoulder Right    Right Shoulder Flexion 130 Degrees    Right Shoulder ABduction 115 Degrees    Right Shoulder Internal Rotation --   75%   Right Shoulder External Rotation --   90%     Strength   Overall Strength Comments MMT sh flex RUE 5/5 (LUE slightly weaker 4/5 only in flexion), RUE abd 3+/5, RUE ER/IR 4/5                                OT Short Term Goals - 02/09/21 1139       OT SHORT TERM GOAL #1   Title Independent with HEP for RUE    Time 3    Period Weeks    Status New      OT SHORT TERM GOAL #2   Title Pt to verbalize understanding of pain reduction strategies RT shoulder including proper positioning, reaching patterns, taping, and use of heat (if ok w/ lymphedema specialist)    Time 3    Period Weeks    Status New               OT Long Term Goals - 02/09/21 1140       OT LONG TERM GOAL #1   Title Pt to report improvement and less rest breaks needed with using blow dryer RUE    Time 6    Period Weeks    Status New      OT LONG TERM GOAL #2   Title Pt to improve shoulder abduction to 120* with pain consistently less than or equal to 4/10    Time 6    Period Weeks    Status New      OT LONG TERM GOAL #3   Title Pt to perform overhead reaching in Rt sh flexion for 3 minutes w/o rest for IADLS (putting away dishes, groceries, etc)    Time 6    Period Weeks    Status New                   Plan - 02/09/21 1132     Clinical  Impression Statement Pt is a 70 y.o. female who presents to OPOT w/ acute pain of Rt shoulder over approx the last month per pt report. Pt with h/o of Rt shoulder reverse arthroplasty 12/2015, evidence of old humeral fx, old Rt clavicle fx, and Lt LE lymphedema. Latest MD notes report no acute fx and prosthesis intact and anatomic. Pt denies any falls or recent injuries. Pt would benefit of trial of O.T. to address shoulder pain, ROM, and strength/endurance as able dominant RUE.    OT Occupational Profile and History Problem Focused Assessment - Including review of records relating to presenting problem    Occupational performance deficits (Please refer to evaluation for details): ADL's;IADL's;Work    Body Structure / Function / Physical Skills ADL;ROM;IADL;Body mechanics;Strength;UE functional use;Pain;Endurance    Rehab Potential Good    Clinical Decision Making Several treatment options, min-mod task modification necessary    Comorbidities Affecting Occupational Performance: Presence of comorbidities impacting occupational performance    Comorbidities impacting occupational performance description: LYMPHEDEMA    Modification or Assistance  to Complete Evaluation  No modification of tasks or assist necessary to complete eval    OT Frequency 2x / week    OT Duration 6 weeks   Over 8 week duration prn due to potential scheduling conflicts (however anticipate only 4 weeks needed)   OT Treatment/Interventions Moist Heat;DME and/or AE instruction;Self-care/ADL training;Therapeutic activities;Therapeutic exercise;Coping strategies training;Neuromuscular education;Passive range of motion;Manual Therapy;Patient/family education;Cryotherapy;Aquatic Therapy    Plan initiate HEP for Rt shoulder and posterior sh girdle strengthening, consider taping to relax upper traps, middle deltoid and correction pc    Consulted and Agree with Plan of Care Patient             Patient will benefit from skilled  therapeutic intervention in order to improve the following deficits and impairments:   Body Structure / Function / Physical Skills: ADL, ROM, IADL, Body mechanics, Strength, UE functional use, Pain, Endurance       Visit Diagnosis: Acute pain of right shoulder  Stiffness of right shoulder, not elsewhere classified  Muscle weakness (generalized)    Problem List Patient Active Problem List   Diagnosis Date Noted   S/p reverse total shoulder arthroplasty 12/14/2015   Endometrial cancer ([place]) 04/08/2015    Carey Bullocks, OTR/L 02/09/2021, 11:43 AM  [facility] [address] [place], [place], [address] Phone: [phone]   Fax:  [phone]  Name: Kendra Fuentes MRN: [id number] Date of Birth: [birth year]

## 2021-02-23 ENCOUNTER — Ambulatory Visit: Payer: Medicare Other | Admitting: Occupational Therapy

## 2021-02-23 ENCOUNTER — Other Ambulatory Visit: Payer: Self-pay

## 2021-02-23 DIAGNOSIS — M6281 Muscle weakness (generalized): Secondary | ICD-10-CM

## 2021-02-23 DIAGNOSIS — M25511 Pain in right shoulder: Secondary | ICD-10-CM | POA: Diagnosis not present

## 2021-02-23 DIAGNOSIS — M25611 Stiffness of right shoulder, not elsewhere classified: Secondary | ICD-10-CM

## 2021-02-23 NOTE — Patient Instructions (Signed)
SHOULDER: Flexion - Supine (Cane)    Hold paper towel in both hands, palms flat and facing each other. Raise arms up overhead. Do not allow back to arch. Hold _3__ seconds. _10__ reps per set,  per day, _2__ days per day  ROM: External Rotation - Wand (Supine)    Lie on back holding wand with elbows bent to 90. Rotate forearms over head as far as possible to forehead, then towards belly button, keeping upper arms out to side.  Repeat _10___ times per set.  Do __2__ sessions per day.  ROM: Horizontal Abduction / Adduction - Wand    Keeping both palms down, push right hand across body with other hand. Then pull back across body, keeping arms parallel to floor. Do not allow trunk to twist. Hold __3__ seconds. Repeat other side Repeat __10__ times per set.  Do __2__ sessions per day.  Scapular Retraction (Prone)    Lie with arms at sides. Pinch shoulder blades together and down towards bottom. (Shoulders will come up towards ceiling, NOT ears) Repeat __10__ times per set.  Do _2___ sessions per day.  Extension - Prone (Dumbbell)    Lie with right arm hanging off side of bed. Lift hand back and up. Repeat __10__ times per set. Do __2__ sets per day

## 2021-02-23 NOTE — Therapy (Signed)
[facility] [address] [place], [place], [address] Phone: [phone]   Fax:  [phone]  Occupational Therapy Treatment  Patient Details  Name: Kendra Fuentes MRN: [id number] Date of Birth: 05/28/1950 Referring Provider (OT): Dr. Shelly Bombard   Encounter Date: 02/23/2021   OT End of Session - 02/23/21 1200     Visit Number 2    Number of Visits 13    Date for OT Re-Evaluation 04/07/21    Authorization Type UHC MCR    Authorization Time Period 2    Authorization - Visit Number 10    Authorization - Number of Visits 10    OT Start Time 0930    OT Stop Time 1015    OT Time Calculation (min) 45 min    Activity Tolerance Patient tolerated treatment well    Behavior During Therapy [facility] for tasks assessed/performed             Past Medical History:  Diagnosis Date   Anxiety    Arthritis    Borderline diabetes    Depression    Endometrial cancer ([place])    GERD (gastroesophageal reflux disease)    Graves disease    Graves disease    radiatiated   Heart murmur    Leg swelling    left ankle   Light-headed feeling    "I eat something then I'm okay"   Radiation 04/16/15, 04/21/15, 04/23/15, 04/28/15, 04/30/15   vaginal cuff 30 gray   Urgency of urination     Past Surgical History:  Procedure Laterality Date   CHOLECYSTECTOMY     COLONOSCOPY     KNEE SURGERY Left    LAPAROSCOPIC PELVIC LYMPH NODE BIOPSY  01/30/15   by Dr. Polly Cobia at [facility] ([facility]) DISTAL RADIAL FRACTURE Left 09/23/2013   Procedure: OPEN TREATMENT OF LEFT DISTAL RADIUS AND SCAPHOID FRACTURES;  Surgeon: Jolyn Nap, MD;  Location: [facility];  Service: Orthopedics;  Laterality: Left;   REVERSE SHOULDER ARTHROPLASTY Right 12/14/2015   Procedure: REVERSE SHOULDER ARTHROPLASTY;  Surgeon: Tania Ade, MD;  Location: [facility];  Service: Orthopedics;  Laterality: Right;  Right reverse total shoulder  arthroplasty   ROBOTIC ASSISTED SUPRACERVICAL HYSTERECTOMY WITH BILATERAL SALPINGO OOPHERECTOMY  01/30/15   by Dr. Polly [facility] at [facility]    There were no vitals filed for this visit.   Subjective Assessment - 02/23/21 0935     Subjective  No changes to report    Pertinent History reverse sh arthroplasty 12/2015, old clavicle fx. h/o lymphedema secondary to CA, ORIF Lt DRF 2015,    Limitations lymphedema Lt LE    Patient Stated Goals Decrease shoulder pain, increase motion/use and endurance of arm    Currently in Pain? Yes    Pain Score 3     Pain Location Shoulder    Pain Orientation Right    Pain Descriptors / Indicators Heaviness   pulling   Pain Type Acute pain    Pain Onset More than a month ago    Pain Frequency Intermittent    Aggravating Factors  certain movements IR    Pain Relieving Factors Muscle relaxer prn             Pt issued HEP for shoulder - see pt instructions for details.                      OT Education - 02/23/21 1009  Education Details HEP    Person(s) Educated Patient    Methods Explanation;Demonstration;Verbal cues;Handout    Comprehension Verbalized understanding;Returned demonstration;Verbal cues required              OT Short Term Goals - 02/09/21 1139       OT SHORT TERM GOAL #1   Title Independent with HEP for RUE    Time 3    Period Weeks    Status New      OT SHORT TERM GOAL #2   Title Pt to verbalize understanding of pain reduction strategies RT shoulder including proper positioning, reaching patterns, taping, and use of heat (if ok w/ lymphedema specialist)    Time 3    Period Weeks    Status New               OT Long Term Goals - 02/09/21 1140       OT LONG TERM GOAL #1   Title Pt to report improvement and less rest breaks needed with using blow dryer RUE    Time 6    Period Weeks    Status New      OT LONG TERM GOAL #2   Title Pt to improve shoulder abduction to 120* with pain  consistently less than or equal to 4/10    Time 6    Period Weeks    Status New      OT LONG TERM GOAL #3   Title Pt to perform overhead reaching in Rt sh flexion for 3 minutes w/o rest for IADLS (putting away dishes, groceries, etc)    Time 6    Period Weeks    Status New                   Plan - 02/23/21 1201     Clinical Impression Statement Pt progressing with HEP    OT Occupational Profile and History Problem Focused Assessment - Including review of records relating to presenting problem    Occupational performance deficits (Please refer to evaluation for details): ADL's;IADL's;Work    Body Structure / Function / Physical Skills ADL;ROM;IADL;Body mechanics;Strength;UE functional use;Pain;Endurance    Rehab Potential Good    Clinical Decision Making Several treatment options, min-mod task modification necessary    Comorbidities Affecting Occupational Performance: Presence of comorbidities impacting occupational performance    Comorbidities impacting occupational performance description: LYMPHEDEMA    Modification or Assistance to Complete Evaluation  No modification of tasks or assist necessary to complete eval    OT Frequency 2x / week    OT Duration 6 weeks   Over 8 week duration prn due to potential scheduling conflicts (however anticipate only 4 weeks needed)   OT Treatment/Interventions Moist Heat;DME and/or AE instruction;Self-care/ADL training;Therapeutic activities;Therapeutic exercise;Coping strategies training;Neuromuscular education;Passive range of motion;Manual Therapy;Patient/family education;Cryotherapy;Aquatic Therapy    Plan review HEP, add BUE IR w/ cane (sliding up back in standing), taping to relax upper traps and middle deltoid    Consulted and Agree with Plan of Care Patient             Patient will benefit from skilled therapeutic intervention in order to improve the following deficits and impairments:   Body Structure / Function / Physical  Skills: ADL, ROM, IADL, Body mechanics, Strength, UE functional use, Pain, Endurance       Visit Diagnosis: Acute pain of right shoulder  Stiffness of right shoulder, not elsewhere classified  Muscle weakness (generalized)    Problem List Patient Active  Problem List   Diagnosis Date Noted   S/p reverse total shoulder arthroplasty 12/14/2015   Endometrial cancer ([place]) 04/08/2015    Carey Bullocks, OTR/L 02/23/2021, 12:02 PM  [facility] [address] [place], [place], [address] Phone: [phone]   Fax:  [phone]  Name: Kendra Fuentes MRN: [id number] Date of Birth: [birth year]

## 2021-02-26 ENCOUNTER — Other Ambulatory Visit: Payer: Self-pay

## 2021-02-26 ENCOUNTER — Ambulatory Visit: Payer: Medicare Other | Admitting: Occupational Therapy

## 2021-02-26 DIAGNOSIS — M25511 Pain in right shoulder: Secondary | ICD-10-CM

## 2021-02-26 DIAGNOSIS — M25611 Stiffness of right shoulder, not elsewhere classified: Secondary | ICD-10-CM

## 2021-02-26 DIAGNOSIS — M6281 Muscle weakness (generalized): Secondary | ICD-10-CM

## 2021-02-26 NOTE — Therapy (Signed)
[facility] [address] [place], [place], [address] Phone: [phone]   Fax:  [phone]  Occupational Therapy Treatment  Patient Details  Name: Kendra Fuentes MRN: [id number] Date of Birth: 1950-08-13 Referring Provider (OT): Dr. Shelly Bombard   Encounter Date: 02/26/2021   OT End of Session - 02/26/21 0902     Visit Number 3    Number of Visits 13    Date for OT Re-Evaluation 04/07/21    Authorization Type UHC MCR    Authorization - Visit Number 3    Authorization - Number of Visits 10    Progress Note Due on Visit 10    OT Start Time 0850    OT Stop Time 0928    OT Time Calculation (min) 38 min             Past Medical History:  Diagnosis Date   Anxiety    Arthritis    Borderline diabetes    Depression    Endometrial cancer ([place])    GERD (gastroesophageal reflux disease)    Graves disease    Graves disease    radiatiated   Heart murmur    Leg swelling    left ankle   Light-headed feeling    "I eat something then I'm okay"   Radiation 04/16/15, 04/21/15, 04/23/15, 04/28/15, 04/30/15   vaginal cuff 30 gray   Urgency of urination     Past Surgical History:  Procedure Laterality Date   CHOLECYSTECTOMY     COLONOSCOPY     KNEE SURGERY Left    LAPAROSCOPIC PELVIC LYMPH NODE BIOPSY  01/30/15   by Dr. Polly Cobia at [place] ([facility]) DISTAL RADIAL FRACTURE Left 09/23/2013   Procedure: OPEN TREATMENT OF LEFT DISTAL RADIUS AND SCAPHOID FRACTURES;  Surgeon: Jolyn Nap, MD;  Location: [facility];  Service: Orthopedics;  Laterality: Left;   REVERSE SHOULDER ARTHROPLASTY Right 12/14/2015   Procedure: REVERSE SHOULDER ARTHROPLASTY;  Surgeon: Tania Ade, MD;  Location: [facility];  Service: Orthopedics;  Laterality: Right;  Right reverse total shoulder arthroplasty   ROBOTIC ASSISTED SUPRACERVICAL HYSTERECTOMY WITH BILATERAL SALPINGO OOPHERECTOMY  01/30/15   by Dr.  Polly Cobia at [facility]    There were no vitals filed for this visit.   Subjective Assessment - 02/26/21 0854     Subjective  My shoulder still locks up sometimes and then pain is "through the roof".    Pertinent History reverse sh arthroplasty 12/2015, old clavicle fx. h/o lymphedema secondary to CA, [facility] Lt DRF 2015,    Limitations lymphedema Lt LE    Patient Stated Goals Decrease shoulder pain, increase motion/use and endurance of arm    Currently in Pain? Yes    Pain Score 3     Pain Location Shoulder    Pain Orientation Right    Pain Descriptors / Indicators Heaviness;Discomfort    Pain Type Acute pain    Pain Onset More than a month ago    Pain Frequency Intermittent    Aggravating Factors  certain movements    Pain Relieving Factors muscle relaxer prn                          Treatment: Kinesiotape applied to deltoid and traps,  for pain relief, pt was educated in wear and precautions Closed chain IR and extension with cane behind back., min v/.c. Wall slides x 10 reps, wall pushups x  10 reps, min v.c for positioning        OT Education - 02/26/21 0911     Education Details reveiwed inital HEP, 10 reps each, min v.c, education regarding kinesiotape wear, care and precautions.    Person(s) Educated Patient    Methods Explanation;Demonstration;Verbal cues;Handout    Comprehension Verbalized understanding;Returned demonstration;Verbal cues required              OT Short Term Goals - 02/09/21 1139       OT SHORT TERM GOAL #1   Title Independent with HEP for RUE    Time 3    Period Weeks    Status New      OT SHORT TERM GOAL #2   Title Pt to verbalize understanding of pain reduction strategies RT shoulder including proper positioning, reaching patterns, taping, and use of heat (if ok w/ lymphedema specialist)    Time 3    Period Weeks    Status New               OT Long Term Goals - 02/09/21 1140       OT LONG TERM GOAL #1    Title Pt to report improvement and less rest breaks needed with using blow dryer RUE    Time 6    Period Weeks    Status New      OT LONG TERM GOAL #2   Title Pt to improve shoulder abduction to 120* with pain consistently less than or equal to 4/10    Time 6    Period Weeks    Status New      OT LONG TERM GOAL #3   Title Pt to perform overhead reaching in Rt sh flexion for 3 minutes w/o rest for IADLS (putting away dishes, groceries, etc)    Time 6    Period Weeks    Status New                   Plan - 02/26/21 1533     Clinical Impression Statement Pt progressing toward goals. She demonstrates understanding of HEP.    OT Occupational Profile and History Problem Focused Assessment - Including review of records relating to presenting problem    Occupational performance deficits (Please refer to evaluation for details): ADL's;IADL's;Work    Body Structure / Function / Physical Skills ADL;ROM;IADL;Body mechanics;Strength;UE functional use;Pain;Endurance    Rehab Potential Good    Clinical Decision Making Several treatment options, min-mod task modification necessary    Comorbidities Affecting Occupational Performance: Presence of comorbidities impacting occupational performance    Comorbidities impacting occupational performance description: LYMPHEDEMA    Modification or Assistance to Complete Evaluation  No modification of tasks or assist necessary to complete eval    OT Frequency 2x / week    OT Duration 6 weeks   Over 8 week duration prn due to potential scheduling conflicts (however anticipate only 4 weeks needed)   OT Treatment/Interventions Moist Heat;DME and/or AE instruction;Self-care/ADL training;Therapeutic activities;Therapeutic exercise;Coping strategies training;Neuromuscular education;Passive range of motion;Manual Therapy;Patient/family education;Cryotherapy;Aquatic Therapy    Plan add BUE IR w/ cane (sliding up back in standing to HEP),check on  taping to relax  upper traps and middle deltoid to see if it helped    Consulted and Agree with Plan of Care Patient             Patient will benefit from skilled therapeutic intervention in order to improve the following deficits and impairments:   Body Structure /  Function / Physical Skills: ADL, ROM, IADL, Body mechanics, Strength, UE functional use, Pain, Endurance       Visit Diagnosis: Acute pain of right shoulder  Stiffness of right shoulder, not elsewhere classified  Muscle weakness (generalized)    Problem List Patient Active Problem List   Diagnosis Date Noted   S/p reverse total shoulder arthroplasty 12/14/2015   Endometrial cancer ([place]) 04/08/2015    Eudelia Hiltunen, OT/L 02/26/2021, 3:35 PM  [facility] [address] [place], [place], [address] Phone: [phone]   Fax:  [phone]  Name: Kendra Fuentes MRN: [id number] Date of Birth: Jul 23, 1950

## 2021-02-26 NOTE — Patient Instructions (Signed)
Kinesiotape instructions  You can leave the kinesiotape on for 4-5 days, if no problems.  However if you develop pain, redness, soreness, irritation you may remove tape. Kinesiotape can get wet in the shower, just do not rub vigorously as it will begin to peel.  To remove kinesiotape: get it wet and peel slowly.

## 2021-03-01 ENCOUNTER — Other Ambulatory Visit: Payer: Self-pay

## 2021-03-01 ENCOUNTER — Encounter: Payer: Self-pay | Admitting: Occupational Therapy

## 2021-03-01 ENCOUNTER — Ambulatory Visit: Payer: Medicare Other | Admitting: Occupational Therapy

## 2021-03-01 DIAGNOSIS — M6281 Muscle weakness (generalized): Secondary | ICD-10-CM

## 2021-03-01 DIAGNOSIS — M25611 Stiffness of right shoulder, not elsewhere classified: Secondary | ICD-10-CM

## 2021-03-01 DIAGNOSIS — M25511 Pain in right shoulder: Secondary | ICD-10-CM

## 2021-03-01 NOTE — Therapy (Signed)
[facility] [address] [place], [place], [address] Phone: [phone]   Fax:  [phone]  Occupational Therapy Treatment  Patient Details  Name: Kendra Fuentes MRN: [id number] Date of Birth: 08/12/1950 Referring Provider (OT): Dr. Shelly Bombard   Encounter Date: 03/01/2021   OT End of Session - 03/01/21 0956     Visit Number 4    Number of Visits 13    Date for OT Re-Evaluation 04/07/21    Authorization Type UHC MCR    Authorization - Visit Number 4    Authorization - Number of Visits 10    Progress Note Due on Visit 10    OT Start Time [phone]    OT Stop Time 1015    OT Time Calculation (min) 39 min    Activity Tolerance Patient tolerated treatment well    Behavior During Therapy [facility] for tasks assessed/performed             Past Medical History:  Diagnosis Date   Anxiety    Arthritis    Borderline diabetes    Depression    Endometrial cancer ([place])    GERD (gastroesophageal reflux disease)    Graves disease    Graves disease    radiatiated   Heart murmur    Leg swelling    left ankle   Light-headed feeling    "I eat something then I'm okay"   Radiation 04/16/15, 04/21/15, 04/23/15, 04/28/15, 04/30/15   vaginal cuff 30 gray   Urgency of urination     Past Surgical History:  Procedure Laterality Date   CHOLECYSTECTOMY     COLONOSCOPY     KNEE SURGERY Left    LAPAROSCOPIC PELVIC LYMPH NODE BIOPSY  01/30/15   by Dr. Polly Cobia at [facility] ([facility]) DISTAL RADIAL FRACTURE Left 09/23/2013   Procedure: OPEN TREATMENT OF LEFT DISTAL RADIUS AND SCAPHOID FRACTURES;  Surgeon: Jolyn Nap, MD;  Location: [facility];  Service: Orthopedics;  Laterality: Left;   REVERSE SHOULDER ARTHROPLASTY Right 12/14/2015   Procedure: REVERSE SHOULDER ARTHROPLASTY;  Surgeon: Tania Ade, MD;  Location: [facility];  Service: Orthopedics;  Laterality: Right;  Right reverse total shoulder  arthroplasty   ROBOTIC ASSISTED SUPRACERVICAL HYSTERECTOMY WITH BILATERAL SALPINGO OOPHERECTOMY  01/30/15   by Dr. Polly Cobia at [facility]    There were no vitals filed for this visit.   Subjective Assessment - 03/01/21 0940     Subjective  back to "normal" chronic pain (vs. initial pain from muscle spasm).  Pt reports that she feels that kinesiotape helped    Pertinent History reverse sh arthroplasty 12/2015, old clavicle fx. h/o lymphedema LLE secondary to Endometrial CA, [facility] Lt DRF 2015,    Limitations lymphedema Lt LE    Patient Stated Goals Decrease shoulder pain, increase motion/use and endurance of arm    Currently in Pain? Yes    Pain Score 3     Pain Location Shoulder    Pain Orientation Right    Pain Descriptors / Indicators Heaviness;Discomfort    Pain Type Acute pain    Pain Onset More than a month ago    Pain Frequency Intermittent    Aggravating Factors  certain movements    Pain Relieving Factors muscle relaxer prn, kinesiotape             Reviewed initial HEP--pt returned demo each with occasional min v.c.  In sitting, cane ex for shoulder flex with min cueing  for posture and positioning.  Standing, AAROM shoulder flex and circumduction with UE ranger with min cueing for scapular stability.  Standing, Wall slides in shoulder flex and abduction closed-chain followed by wall push-ups x10 each with min v.c. for posture/positioning.         OT Education - 03/01/21 1025     Education Details Added to HEP (shoulder ext with scapular retraction, abduction, and IR with cane)    Person(s) Educated Patient    Methods Explanation;Demonstration;Verbal cues;Handout    Comprehension Returned demonstration;Verbalized understanding;Verbal cues required              OT Short Term Goals - 02/09/21 1139       OT SHORT TERM GOAL #1   Title Independent with HEP for RUE    Time 3    Period Weeks    Status New      OT SHORT TERM GOAL #2   Title Pt to  verbalize understanding of pain reduction strategies RT shoulder including proper positioning, reaching patterns, taping, and use of heat (if ok w/ lymphedema specialist)    Time 3    Period Weeks    Status New               OT Long Term Goals - 02/09/21 1140       OT LONG TERM GOAL #1   Title Pt to report improvement and less rest breaks needed with using blow dryer RUE    Time 6    Period Weeks    Status New      OT LONG TERM GOAL #2   Title Pt to improve shoulder abduction to 120* with pain consistently less than or equal to 4/10    Time 6    Period Weeks    Status New      OT LONG TERM GOAL #3   Title Pt to perform overhead reaching in Rt sh flexion for 3 minutes w/o rest for IADLS (putting away dishes, groceries, etc)    Time 6    Period Weeks    Status New                   Plan - 03/01/21 4332     Clinical Impression Statement Pt progressing toward goals. She is tolerating exercise/ROM with no incr in pain.    OT Occupational Profile and History Problem Focused Assessment - Including review of records relating to presenting problem    Occupational performance deficits (Please refer to evaluation for details): ADL's;IADL's;Work    Body Structure / Function / Physical Skills ADL;ROM;IADL;Body mechanics;Strength;UE functional use;Pain;Endurance    Rehab Potential Good    Clinical Decision Making Several treatment options, min-mod task modification necessary    Comorbidities Affecting Occupational Performance: Presence of comorbidities impacting occupational performance    Comorbidities impacting occupational performance description: LYMPHEDEMA    Modification or Assistance to Complete Evaluation  No modification of tasks or assist necessary to complete eval    OT Frequency 2x / week    OT Duration 6 weeks   Over 8 week duration prn due to potential scheduling conflicts (however anticipate only 4 weeks needed)   OT Treatment/Interventions Moist Heat;DME  and/or AE instruction;Self-care/ADL training;Therapeutic activities;Therapeutic exercise;Coping strategies training;Neuromuscular education;Passive range of motion;Manual Therapy;Patient/family education;Cryotherapy;Aquatic Therapy    Plan re-tape to relax upper traps and middle deltoid, continue with ROM, ?UBE    Consulted and Agree with Plan of Care Patient  Patient will benefit from skilled therapeutic intervention in order to improve the following deficits and impairments:   Body Structure / Function / Physical Skills: ADL, ROM, IADL, Body mechanics, Strength, UE functional use, Pain, Endurance       Visit Diagnosis: Acute pain of right shoulder  Stiffness of right shoulder, not elsewhere classified  Muscle weakness (generalized)    Problem List Patient Active Problem List   Diagnosis Date Noted   S/p reverse total shoulder arthroplasty 12/14/2015   Endometrial cancer ([place]) 04/08/2015    Tristan Bramble, OT/L 03/01/2021, 10:34 AM  [facility] [address] [place], [place], [address] Phone: [phone]   Fax:  [phone]  Name: Kendra Fuentes MRN: [id number] Date of Birth: April 03, 1951  Vianne Bulls, OTR/L [facility] [address] [place], [place]  [address] [phone] phone [phone] 03/01/21 10:34 AM

## 2021-03-01 NOTE — Patient Instructions (Signed)
Cane Exercise: Extension / Internal Rotation    Stand holding cane behind back with both hands palm-up. Slide cane up spine toward head. Hold ____ seconds. Repeat 10 times. Do 2 sessions per day.   ROM: Abduction - Wand   Sitting, Holding wand with left hand palm up, push wand directly out to side, leading with other hand palm down, until stretch is felt. Hold 5 seconds. Repeat 10 times per set. Do 2 sessions per day.    ROM: Extension - Wand (Standing)   Stand holding wand behind back. Raise arms as far as possible.  PALMS FACING FORWARD Repeat 10 times per set.  Do 2 sessions per day.

## 2021-03-03 ENCOUNTER — Other Ambulatory Visit: Payer: Self-pay

## 2021-03-03 ENCOUNTER — Ambulatory Visit: Payer: Medicare Other | Admitting: Occupational Therapy

## 2021-03-03 DIAGNOSIS — M25611 Stiffness of right shoulder, not elsewhere classified: Secondary | ICD-10-CM

## 2021-03-03 DIAGNOSIS — M25511 Pain in right shoulder: Secondary | ICD-10-CM | POA: Diagnosis not present

## 2021-03-03 DIAGNOSIS — M6281 Muscle weakness (generalized): Secondary | ICD-10-CM

## 2021-03-03 NOTE — Therapy (Signed)
[facility] [address] [place], [place], [address] Phone: [phone]   Fax:  [phone]  Occupational Therapy Treatment  Patient Details  Name: Kendra Fuentes MRN: [id number] Date of Birth: Mar 03, 1951 Referring Provider (OT): Dr. Shelly Bombard   Encounter Date: 03/03/2021   OT End of Session - 03/03/21 0924     Visit Number 5    Number of Visits 13    Date for OT Re-Evaluation 04/07/21    Authorization Type UHC MCR    Authorization - Visit Number 5    Authorization - Number of Visits 10    Progress Note Due on Visit 10    OT Start Time 0850    OT Stop Time 0930    OT Time Calculation (min) 40 min    Activity Tolerance Patient tolerated treatment well    Behavior During Therapy [facility] for tasks assessed/performed             Past Medical History:  Diagnosis Date   Anxiety    Arthritis    Borderline diabetes    Depression    Endometrial cancer ([place])    GERD (gastroesophageal reflux disease)    Graves disease    Graves disease    radiatiated   Heart murmur    Leg swelling    left ankle   Light-headed feeling    "I eat something then I'm okay"   Radiation 04/16/15, 04/21/15, 04/23/15, 04/28/15, 04/30/15   vaginal cuff 30 gray   Urgency of urination     Past Surgical History:  Procedure Laterality Date   CHOLECYSTECTOMY     COLONOSCOPY     KNEE SURGERY Left    LAPAROSCOPIC PELVIC LYMPH NODE BIOPSY  01/30/15   by Dr. Polly Cobia at [facility] ([facility]) DISTAL RADIAL FRACTURE Left 09/23/2013   Procedure: OPEN TREATMENT OF LEFT DISTAL RADIUS AND SCAPHOID FRACTURES;  Surgeon: Jolyn Nap, MD;  Location: [facility];  Service: Orthopedics;  Laterality: Left;   REVERSE SHOULDER ARTHROPLASTY Right 12/14/2015   Procedure: REVERSE SHOULDER ARTHROPLASTY;  Surgeon: Tania Ade, MD;  Location: [facility];  Service: Orthopedics;  Laterality: Right;  Right reverse total shoulder  arthroplasty   ROBOTIC ASSISTED SUPRACERVICAL HYSTERECTOMY WITH BILATERAL SALPINGO OOPHERECTOMY  01/30/15   by Dr. Polly C[facility]    There were no vitals filed for this visit.   Subjective Assessment - 03/03/21 0904     Subjective  Not really pain, just heavy feeling. I may get a little pain occasionally from overuse. My Lt shoulder has actually begun to hurt some w/ certain movements    Pertinent History reverse sh arthroplasty 12/2015, old clavicle fx. h/o lymphedema LLE secondary to Endometrial CA, [facility] Lt DRF 2015,    Limitations lymphedema Lt LE    Patient Stated Goals Decrease shoulder pain, increase motion/use and endurance of arm    Currently in Pain? No/denies    Pain Onset More than a month ago             Re-applied kinesiotape to relax upper traps and middle deltoid, and reviewed wear and care. Advised pt to take off Sunday if she wants to reapply at next Monday's appt.   Reviewed all cane HEP's supine and seated/standing. Modified IR w/ cane in standing d/t pain in Lt shoulder now. Pt also required cueing for BUE sh ext w/ cane in standing to prevent Lt shoulder hiking and recommended standing in front  of mirror to prevent.   UBE x 5 min for UE endurance, level 1                       OT Short Term Goals - 03/03/21 0925       OT SHORT TERM GOAL #1   Title Independent with HEP for RUE    Time 3    Period Weeks    Status Achieved      OT SHORT TERM GOAL #2   Title Pt to verbalize understanding of pain reduction strategies RT shoulder including proper positioning, reaching patterns, taping, and use of heat (if ok w/ lymphedema specialist)    Time 3    Period Weeks    Status On-going               OT Long Term Goals - 03/03/21 0926       OT LONG TERM GOAL #1   Title Pt to report improvement and less rest breaks needed with using blow dryer RUE    Time 6    Period Weeks    Status On-going      OT LONG TERM GOAL #2   Title  Pt to improve shoulder abduction to 120* with pain consistently less than or equal to 4/10    Time 6    Period Weeks    Status On-going      OT LONG TERM GOAL #3   Title Pt to perform overhead reaching in Rt sh flexion for 3 minutes w/o rest for IADLS (putting away dishes, groceries, etc)    Time 6    Period Weeks    Status New                   Plan - 03/03/21 2353     Clinical Impression Statement Pt progressing toward goals. She is tolerating exercise/ROM with no incr in pain. However pt does report some new pain in Lt shoulder w/ IR - noted sh hiking w/ IR    OT Occupational Profile and History Problem Focused Assessment - Including review of records relating to presenting problem    Occupational performance deficits (Please refer to evaluation for details): ADL's;IADL's;Work    Body Structure / Function / Physical Skills ADL;ROM;IADL;Body mechanics;Strength;UE functional use;Pain;Endurance    Rehab Potential Good    Clinical Decision Making Several treatment options, min-mod task modification necessary    Comorbidities Affecting Occupational Performance: Presence of comorbidities impacting occupational performance    Comorbidities impacting occupational performance description: LYMPHEDEMA    Modification or Assistance to Complete Evaluation  No modification of tasks or assist necessary to complete eval    OT Frequency 2x / week    OT Duration 6 weeks   Over 8 week duration prn due to potential scheduling conflicts (however anticipate only 4 weeks needed)   OT Treatment/Interventions Moist Heat;DME and/or AE instruction;Self-care/ADL training;Therapeutic activities;Therapeutic exercise;Coping strategies training;Neuromuscular education;Passive range of motion;Manual Therapy;Patient/family education;Cryotherapy;Aquatic Therapy    Plan re-tape to relax middle deltoid and upper trap, correction pc to both shoulders for scap retraction and depression, prone - scapula  retraction/depression ex's    Consulted and Agree with Plan of Care Patient             Patient will benefit from skilled therapeutic intervention in order to improve the following deficits and impairments:   Body Structure / Function / Physical Skills: ADL, ROM, IADL, Body mechanics, Strength, UE functional use, Pain, Endurance  Visit Diagnosis: Acute pain of right shoulder  Stiffness of right shoulder, not elsewhere classified  Muscle weakness (generalized)    Problem List Patient Active Problem List   Diagnosis Date Noted   S/p reverse total shoulder arthroplasty 12/14/2015   Endometrial cancer ([place]) 04/08/2015    Carey Bullocks, OTR/L 03/03/2021, 10:34 AM  [facility] [address] [place], [place], [address] Phone: [phone]   Fax:  [phone]  Name: Kendra Fuentes MRN: [id number] Date of Birth: [birth year]

## 2021-03-08 ENCOUNTER — Ambulatory Visit: Payer: Medicare Other | Admitting: Occupational Therapy

## 2021-03-08 ENCOUNTER — Other Ambulatory Visit: Payer: Self-pay

## 2021-03-08 DIAGNOSIS — M25511 Pain in right shoulder: Secondary | ICD-10-CM | POA: Diagnosis not present

## 2021-03-08 DIAGNOSIS — M25611 Stiffness of right shoulder, not elsewhere classified: Secondary | ICD-10-CM

## 2021-03-08 DIAGNOSIS — M6281 Muscle weakness (generalized): Secondary | ICD-10-CM

## 2021-03-08 NOTE — Therapy (Signed)
[facility] [address] [place], [place], [address] Phone: [phone]   Fax:  [phone]  Occupational Therapy Treatment  Patient Details  Name: Kendra Fuentes MRN: [id number] Date of Birth: 08-19-1950 Referring Provider (OT): Dr. Shelly Bombard   Encounter Date: 03/08/2021   OT End of Session - 03/08/21 1029     Visit Number 6    Number of Visits 13    Date for OT Re-Evaluation 04/07/21    Authorization Type UHC MCR    Authorization - Visit Number 6    Authorization - Number of Visits 10    Progress Note Due on Visit 10    OT Start Time 0932    OT Stop Time 1015    OT Time Calculation (min) 43 min    Activity Tolerance Patient tolerated treatment well    Behavior During Therapy [facility] for tasks assessed/performed             Past Medical History:  Diagnosis Date   Anxiety    Arthritis    Borderline diabetes    Depression    Endometrial cancer ([place])    GERD (gastroesophageal reflux disease)    Graves disease    Graves disease    radiatiated   Heart murmur    Leg swelling    left ankle   Light-headed feeling    "I eat something then I'm okay"   Radiation 04/16/15, 04/21/15, 04/23/15, 04/28/15, 04/30/15   vaginal cuff 30 gray   Urgency of urination     Past Surgical History:  Procedure Laterality Date   CHOLECYSTECTOMY     COLONOSCOPY     KNEE SURGERY Left    LAPAROSCOPIC PELVIC LYMPH NODE BIOPSY  01/30/15   by Dr. Polly Cobia at [place] ([facility]) DISTAL RADIAL FRACTURE Left 09/23/2013   Procedure: OPEN TREATMENT OF LEFT DISTAL RADIUS AND SCAPHOID FRACTURES;  Surgeon: Jolyn Nap, MD;  Location: [facility];  Service: Orthopedics;  Laterality: Left;   REVERSE SHOULDER ARTHROPLASTY Right 12/14/2015   Procedure: REVERSE SHOULDER ARTHROPLASTY;  Surgeon: Tania Ade, MD;  Location: [employer] [facility];  Service: Orthopedics;  Laterality: Right;  Right reverse total shoulder  arthroplasty   ROBOTIC ASSISTED SUPRACERVICAL HYSTERECTOMY WITH BILATERAL SALPINGO OOPHERECTOMY  01/30/15   by Dr. Polly Cobia at [facility]    There were no vitals filed for this visit.   Subjective Assessment - 03/08/21 1029     Subjective  I tense up a lot    Pertinent History reverse sh arthroplasty 12/2015, old clavicle fx. h/o lymphedema LLE secondary to Endometrial CA, [facility] Lt DRF 2015,    Limitations lymphedema Lt LE    Patient Stated Goals Decrease shoulder pain, increase motion/use and endurance of arm    Currently in Pain? No/denies    Pain Onset More than a month ago            Kinesiotaped bilateral shoulders today secondary to occasional pain Lt shoulder: taped to relax bilateral middle deltoids, upper traps, and activation piece for scapula retraction and depression bilaterally  Prone: focused on scapula stabilization and posterior sh strengthening ex's including: bilateral scapula retraction, shoulder extension to each side, and wt bearing on elbows for trunk ext and scap depression. Tactile cues provided to avoid sh hiking w/ sh extension ex.  Seated: worked on shoulder flexion w/ normal scapulohumeral rhythm Reviewed pects stretch bilaterally in doorframe - pt instructed to avoid pain, only stretch and modified  ex to prevent pain                       OT Short Term Goals - 03/08/21 1030       OT SHORT TERM GOAL #1   Title Independent with HEP for RUE    Time 3    Period Weeks    Status Achieved      OT SHORT TERM GOAL #2   Title Pt to verbalize understanding of pain reduction strategies RT shoulder including proper positioning, reaching patterns, taping, and use of heat (if ok w/ lymphedema specialist)    Time 3    Period Weeks    Status Achieved               OT Long Term Goals - 03/03/21 0926       OT LONG TERM GOAL #1   Title Pt to report improvement and less rest breaks needed with using blow dryer RUE    Time 6    Period  Weeks    Status On-going      OT LONG TERM GOAL #2   Title Pt to improve shoulder abduction to 120* with pain consistently less than or equal to 4/10    Time 6    Period Weeks    Status On-going      OT LONG TERM GOAL #3   Title Pt to perform overhead reaching in Rt sh flexion for 3 minutes w/o rest for IADLS (putting away dishes, groceries, etc)    Time 6    Period Weeks    Status New                   Plan - 03/08/21 1031     Clinical Impression Statement Pt with greater awareness into proper reaching patterns and preventing shoulder hiking    OT Occupational Profile and History Problem Focused Assessment - Including review of records relating to presenting problem    Occupational performance deficits (Please refer to evaluation for details): ADL's;IADL's;Work    Body Structure / Function / Physical Skills ADL;ROM;IADL;Body mechanics;Strength;UE functional use;Pain;Endurance    Rehab Potential Good    Clinical Decision Making Several treatment options, min-mod task modification necessary    Comorbidities Affecting Occupational Performance: Presence of comorbidities impacting occupational performance    Comorbidities impacting occupational performance description: LYMPHEDEMA    Modification or Assistance to Complete Evaluation  No modification of tasks or assist necessary to complete eval    OT Frequency 2x / week    OT Duration 6 weeks   Over 8 week duration prn due to potential scheduling conflicts (however anticipate only 4 weeks needed)   OT Treatment/Interventions Moist Heat;DME and/or AE instruction;Self-care/ADL training;Therapeutic activities;Therapeutic exercise;Coping strategies training;Neuromuscular education;Passive range of motion;Manual Therapy;Patient/family education;Cryotherapy;Aquatic Therapy    Plan progress towards LTG's, UBE    Consulted and Agree with Plan of Care Patient             Patient will benefit from skilled therapeutic intervention in  order to improve the following deficits and impairments:   Body Structure / Function / Physical Skills: ADL, ROM, IADL, Body mechanics, Strength, UE functional use, Pain, Endurance       Visit Diagnosis: Stiffness of right shoulder, not elsewhere classified  Muscle weakness (generalized)    Problem List Patient Active Problem List   Diagnosis Date Noted   S/p reverse total shoulder arthroplasty 12/14/2015   Endometrial cancer ([place]) 04/08/2015    Quame Spratlin,  Barkley Boards, OTR/L 03/08/2021, 10:32 AM  [facility] [address] [place], [place], [address] Phone: [phone]   Fax:  [phone]  Name: Kendra Fuentes MRN: [id number] Date of Birth: 14-Jul-1950

## 2021-03-09 ENCOUNTER — Ambulatory Visit: Payer: Medicare Other | Attending: Family Medicine | Admitting: Occupational Therapy

## 2021-03-09 DIAGNOSIS — M25611 Stiffness of right shoulder, not elsewhere classified: Secondary | ICD-10-CM | POA: Diagnosis not present

## 2021-03-09 DIAGNOSIS — M25511 Pain in right shoulder: Secondary | ICD-10-CM | POA: Diagnosis present

## 2021-03-09 DIAGNOSIS — M6281 Muscle weakness (generalized): Secondary | ICD-10-CM | POA: Diagnosis present

## 2021-03-09 NOTE — Therapy (Signed)
[facility] [address] [place], [place], [address] Phone: [phone]   Fax:  [phone]  Occupational Therapy Treatment  Patient Details  Name: Kendra Fuentes MRN: [id number] Date of Birth: 1951/01/13 Referring Provider (OT): Dr. Shelly Bombard   Encounter Date: 03/09/2021   OT End of Session - 03/09/21 1433     Visit Number 7    Number of Visits 13    Date for OT Re-Evaluation 04/07/21    Authorization Type UHC MCR    Authorization - Visit Number 7    Authorization - Number of Visits 10    Progress Note Due on Visit 10    OT Start Time 1405    OT Stop Time 1445    OT Time Calculation (min) 40 min    Activity Tolerance Patient tolerated treatment well    Behavior During Therapy [facility] for tasks assessed/performed             Past Medical History:  Diagnosis Date   Anxiety    Arthritis    Borderline diabetes    Depression    Endometrial cancer ([place])    GERD (gastroesophageal reflux disease)    Graves disease    Graves disease    radiatiated   Heart murmur    Leg swelling    left ankle   Light-headed feeling    "I eat something then I'm okay"   Radiation 04/16/15, 04/21/15, 04/23/15, 04/28/15, 04/30/15   vaginal cuff 30 gray   Urgency of urination     Past Surgical History:  Procedure Laterality Date   CHOLECYSTECTOMY     COLONOSCOPY     KNEE SURGERY Left    LAPAROSCOPIC PELVIC LYMPH NODE BIOPSY  01/30/15   by Dr. Polly Cobia at [facility] ([facility]) DISTAL RADIAL FRACTURE Left 09/23/2013   Procedure: OPEN TREATMENT OF LEFT DISTAL RADIUS AND SCAPHOID FRACTURES;  Surgeon: Jolyn Nap, MD;  Location: [facility];  Service: Orthopedics;  Laterality: Left;   REVERSE SHOULDER ARTHROPLASTY Right 12/14/2015   Procedure: REVERSE SHOULDER ARTHROPLASTY;  Surgeon: Tania Ade, MD;  Location: [facility];  Service: Orthopedics;  Laterality: Right;  Right reverse total shoulder  arthroplasty   ROBOTIC ASSISTED SUPRACERVICAL HYSTERECTOMY WITH BILATERAL SALPINGO OOPHERECTOMY  01/30/15   by Dr. Polly [facility]obia at [facility]    There were no vitals filed for this visit.   Subjective Assessment - 03/09/21 1419     Subjective  I get tired    Pertinent History reverse sh arthroplasty 12/2015, old clavicle fx. h/o lymphedema LLE secondary to Endometrial CA, [facility] Lt DRF 2015,    Patient Stated Goals Decrease shoulder pain, increase motion/use and endurance of arm    Currently in Pain? No/denies                      Treatment:Pt reports that kinesiotape is working fine. Will plan to cancel tomorrow's OT as pt was mistakenly scheduled 3x this week Prone: focused on scapula stabilization and posterior sh strengthening ex's including: bilateral scapula retraction, shoulder extension to each side, then with RUE over edge of mat,  and wt bearing on elbows for trunk ext and scap depression. Tactile cues provided to avoid sh hiking w/ sh extension ex.  Seated: worked on shoulder flexion  closed chain in pain free ROM. Reviewed pects stretch bilaterally in doorframe  10 reps min v.c- pt instructed to avoid pain, only stretch and modified ex  to prevent pain  Wall pushups x 10 reps min v.c for positioning  UBE x 8 mins level 4 for conditioning, no adverse reactions.  Functional reach to place / remove graded clothespins with RUE min v.c for positioning  Cane exercises for : shoulder extension, internal /external rotation, and right shoulder abduction within pain free ROM, min v.c                OT Short Term Goals - 03/09/21 1422       OT SHORT TERM GOAL #1   Title Independent with HEP for RUE    Time 3    Period Weeks    Status Achieved      OT SHORT TERM GOAL #2   Title Pt to verbalize understanding of pain reduction strategies RT shoulder including proper positioning, reaching patterns, taping, and use of heat (if ok w/ lymphedema specialist)    Time  3    Period Weeks    Status Achieved               OT Long Term Goals - 03/09/21 1422       OT LONG TERM GOAL #1   Title Pt to report improvement and less rest breaks needed with using blow dryer RUE    Time 6    Period Weeks    Status On-going      OT LONG TERM GOAL #2   Title Pt to improve shoulder abduction to 120* with pain consistently less than or equal to 4/10    Time 6    Period Weeks    Status On-going      OT LONG TERM GOAL #3   Title Pt to perform overhead reaching in Rt sh flexion for 3 minutes w/o rest for IADLS (putting away dishes, groceries, etc)    Time 6    Period Weeks    Status On-going                   Plan - 03/09/21 1431     Clinical Impression Statement Pt is progressing towards goals. She has decreased overall pain in RUE now and is more aware of proper positioning    OT Occupational Profile and History Problem Focused Assessment - Including review of records relating to presenting problem    Occupational performance deficits (Please refer to evaluation for details): ADL's;IADL's;Work    Body Structure / Function / Physical Skills ADL;ROM;IADL;Body mechanics;Strength;UE functional use;Pain;Endurance    Rehab Potential Good    Clinical Decision Making Several treatment options, min-mod task modification necessary    Comorbidities Affecting Occupational Performance: Presence of comorbidities impacting occupational performance    Comorbidities impacting occupational performance description: LYMPHEDEMA    Modification or Assistance to Complete Evaluation  No modification of tasks or assist necessary to complete eval    OT Frequency 2x / week    OT Duration 6 weeks   Over 8 week duration prn due to potential scheduling conflicts (however anticipate only 4 weeks needed)   OT Treatment/Interventions Moist Heat;DME and/or AE instruction;Self-care/ADL training;Therapeutic activities;Therapeutic exercise;Coping strategies training;Neuromuscular  education;Passive range of motion;Manual Therapy;Patient/family education;Cryotherapy;Aquatic Therapy    Plan progress towards LTG's, UBE, pt is only scheduled through next week, ? check goals possible d/c    Consulted and Agree with Plan of Care Patient             Patient will benefit from skilled therapeutic intervention in order to improve the following deficits and impairments:  Body Structure / Function / Physical Skills: ADL, ROM, IADL, Body mechanics, Strength, UE functional use, Pain, Endurance       Visit Diagnosis: Stiffness of right shoulder, not elsewhere classified  Muscle weakness (generalized)  Acute pain of right shoulder    Problem List Patient Active Problem List   Diagnosis Date Noted   S/p reverse total shoulder arthroplasty 12/14/2015   Endometrial cancer ([place]) 04/08/2015    Matti Killingsworth, OT/L 03/09/2021, 2:34 PM  [facility] [address] [place], [place], [address] Phone: [phone]   Fax:  [phone]  Name: JANAA ACERO MRN: [id number] Date of Birth: [birth year]

## 2021-03-10 ENCOUNTER — Ambulatory Visit: Payer: Medicare Other | Admitting: Occupational Therapy

## 2021-03-16 ENCOUNTER — Other Ambulatory Visit: Payer: Self-pay

## 2021-03-16 ENCOUNTER — Ambulatory Visit: Payer: Medicare Other | Admitting: Occupational Therapy

## 2021-03-16 DIAGNOSIS — M6281 Muscle weakness (generalized): Secondary | ICD-10-CM

## 2021-03-16 DIAGNOSIS — M25611 Stiffness of right shoulder, not elsewhere classified: Secondary | ICD-10-CM

## 2021-03-16 NOTE — Therapy (Signed)
[facility] [address] [place], [place], [address] Phone: [phone]   Fax:  [phone]  Occupational Therapy Treatment  Patient Details  Name: Kendra Fuentes MRN: [id number] Date of Birth: 1950-09-01 Referring Provider (OT): Dr. Shelly Bombard   Encounter Date: 03/16/2021   OT End of Session - 03/16/21 0942     Visit Number 8    Number of Visits 13    Date for OT Re-Evaluation 04/07/21    Authorization Type [employer] MCR    Authorization Time Period 3    Authorization - Visit Number 7    Authorization - Number of Visits    Progress Note Due on Visit 10   OT Start Time 0935    OT Stop Time 1015    OT Time Calculation (min) 40 min    Activity Tolerance Patient tolerated treatment well    Behavior During Therapy [facility] for tasks assessed/performed             Past Medical History:  Diagnosis Date   Anxiety    Arthritis    Borderline diabetes    Depression    Endometrial cancer ([place])    GERD (gastroesophageal reflux disease)    Graves disease    Graves disease    radiatiated   Heart murmur    Leg swelling    left ankle   Light-headed feeling    "I eat something then I'm okay"   Radiation 04/16/15, 04/21/15, 04/23/15, 04/28/15, 04/30/15   vaginal cuff 30 gray   Urgency of urination     Past Surgical History:  Procedure Laterality Date   CHOLECYSTECTOMY     COLONOSCOPY     KNEE SURGERY Left    LAPAROSCOPIC PELVIC LYMPH NODE BIOPSY  01/30/15   by Dr. Polly Cobia at [place] ([facility]) DISTAL RADIAL FRACTURE Left 09/23/2013   Procedure: OPEN TREATMENT OF LEFT DISTAL RADIUS AND SCAPHOID FRACTURES;  Surgeon: Jolyn Nap, MD;  Location: [facility];  Service: Orthopedics;  Laterality: Left;   REVERSE SHOULDER ARTHROPLASTY Right 12/14/2015   Procedure: REVERSE SHOULDER ARTHROPLASTY;  Surgeon: Tania Ade, MD;  Location: [facility];  Service: Orthopedics;  Laterality: Right;   Right reverse total shoulder arthroplasty   ROBOTIC ASSISTED SUPRACERVICAL HYSTERECTOMY WITH BILATERAL SALPINGO OOPHERECTOMY  01/30/15   by Dr. Polly Cobia at [facility]    There were no vitals filed for this visit.   Subjective Assessment - 03/16/21 0942     Subjective  Just heaviness.    Pertinent History reverse sh arthroplasty 12/2015, old clavicle fx. h/o lymphedema LLE secondary to Endometrial CA, [facility]IF Lt DRF 2015,    Patient Stated Goals Decrease shoulder pain, increase motion/use and endurance of arm    Currently in Pain? No/denies             Reviewed goals and progress to date - see below.  Pt able to perform reaching activities for 3 min to retrieve/replace light weight objects from high shelf.   Hot pack x 10 min Rt shoulder for pain.   UBE x 8 min, level 4 for UB conditioning/endurance  Theraband ex issued for posterior sh strengthening. Reviewed previous HEP's                     OT Education - 03/16/21 1015     Education Details theraband ex in bilateral shoulder extension    Person(s) Educated Patient    Methods Explanation;Demonstration;Verbal cues;Handout  Comprehension Returned demonstration;Verbalized understanding              OT Short Term Goals - 03/09/21 1422       OT SHORT TERM GOAL #1   Title Independent with HEP for RUE    Time 3    Period Weeks    Status Achieved      OT SHORT TERM GOAL #2   Title Pt to verbalize understanding of pain reduction strategies RT shoulder including proper positioning, reaching patterns, taping, and use of heat (if ok w/ lymphedema specialist)    Time 3    Period Weeks    Status Achieved               OT Long Term Goals - 03/16/21 1010       OT LONG TERM GOAL #1   Title Pt to report improvement and less rest breaks needed with using blow dryer RUE    Time 6    Period Weeks    Status Achieved      OT LONG TERM GOAL #2   Title Pt to improve shoulder abduction to 120* with  pain consistently less than or equal to 4/10    Time 6    Period Weeks    Status Achieved   140*     OT LONG TERM GOAL #3   Title Pt to perform overhead reaching in Rt sh flexion for 3 minutes w/o rest for IADLS (putting away dishes, groceries, etc)    Time 6    Period Weeks    Status Achieved                   Plan - 03/16/21 1010     Clinical Impression Statement Pt has met all goals. Pt with improvements in ROM and endurance, but still requires cues at times to prevent shoulder compensations    OT Occupational Profile and History Problem Focused Assessment - Including review of records relating to presenting problem    Occupational performance deficits (Please refer to evaluation for details): ADL's;IADL's;Work    Body Structure / Function / Physical Skills ADL;ROM;IADL;Body mechanics;Strength;UE functional use;Pain;Endurance    Rehab Potential Good    Clinical Decision Making Several treatment options, min-mod task modification necessary    Comorbidities Affecting Occupational Performance: Presence of comorbidities impacting occupational performance    Comorbidities impacting occupational performance description: LYMPHEDEMA    Modification or Assistance to Complete Evaluation  No modification of tasks or assist necessary to complete eval    OT Frequency 2x / week    OT Duration 6 weeks   Over 8 week duration prn due to potential scheduling conflicts (however anticipate only 4 weeks needed)   OT Treatment/Interventions Moist Heat;DME and/or AE instruction;Self-care/ADL training;Therapeutic activities;Therapeutic exercise;Coping strategies training;Neuromuscular education;Passive range of motion;Manual Therapy;Patient/family education;Cryotherapy;Aquatic Therapy    Plan hot pack Rt shoulder, review theraband ex, UBE, d/c next session    Consulted and Agree with Plan of Care Patient             Patient will benefit from skilled therapeutic intervention in order to  improve the following deficits and impairments:   Body Structure / Function / Physical Skills: ADL, ROM, IADL, Body mechanics, Strength, UE functional use, Pain, Endurance       Visit Diagnosis: Stiffness of right shoulder, not elsewhere classified  Muscle weakness (generalized)    Problem List Patient Active Problem List   Diagnosis Date Noted   S/p reverse total shoulder arthroplasty 12/14/2015  Endometrial cancer ([place]) 04/08/2015    Carey Bullocks, OTR/L 03/16/2021, 2:07 PM  [facility] [address] [place], [place], [address] Phone: [phone]   Fax:  [phone]  Name: Kendra Fuentes MRN: [id number] Date of Birth: 1950/06/27

## 2021-03-16 NOTE — Patient Instructions (Signed)
    Strengthening: Resisted Extension   Hold tubing in __both___ hand(s), arm forward. Pull arm back, elbow straight. Keep shoulders down Repeat _10___ times per set. Do _1-2___ sessions per day, every other day.

## 2021-03-19 ENCOUNTER — Other Ambulatory Visit: Payer: Self-pay

## 2021-03-19 ENCOUNTER — Ambulatory Visit: Payer: Medicare Other | Admitting: Occupational Therapy

## 2021-03-19 DIAGNOSIS — M25611 Stiffness of right shoulder, not elsewhere classified: Secondary | ICD-10-CM

## 2021-03-19 DIAGNOSIS — M25511 Pain in right shoulder: Secondary | ICD-10-CM

## 2021-03-19 DIAGNOSIS — M6281 Muscle weakness (generalized): Secondary | ICD-10-CM

## 2021-03-19 NOTE — Therapy (Signed)
[facility] [address] [place], [place], [address] Phone: [phone]   Fax:  [phone]  Occupational Therapy Treatment  Patient Details  Name: Kendra Fuentes MRN: [id number] Date of Birth: 1951/05/02 Referring Provider (OT): Dr. Shelly Bombard   Encounter Date: 03/19/2021   OT End of Session - 03/19/21 0939     Visit Number 9    Number of Visits 13    Date for OT Re-Evaluation 04/07/21    Authorization Type UHC MCR    Authorization - Visit Number 8    Authorization - Number of Visits 10    Progress Note Due on Visit 10    OT Start Time 0933    OT Stop Time 1013    OT Time Calculation (min) 40 min             Past Medical History:  Diagnosis Date   Anxiety    Arthritis    Borderline diabetes    Depression    Endometrial cancer ([place])    GERD (gastroesophageal reflux disease)    Graves disease    Graves disease    radiatiated   Heart murmur    Leg swelling    left ankle   Light-headed feeling    "I eat something then I'm okay"   Radiation 04/16/15, 04/21/15, 04/23/15, 04/28/15, 04/30/15   vaginal cuff 30 gray   Urgency of urination     Past Surgical History:  Procedure Laterality Date   CHOLECYSTECTOMY     COLONOSCOPY     KNEE SURGERY Left    LAPAROSCOPIC PELVIC LYMPH NODE BIOPSY  01/30/15   by Dr. Polly Cobia at [facility] ([facility]) DISTAL RADIAL FRACTURE Left 09/23/2013   Procedure: OPEN TREATMENT OF LEFT DISTAL RADIUS AND SCAPHOID FRACTURES;  Surgeon: Jolyn Nap, MD;  Location: [facility];  Service: Orthopedics;  Laterality: Left;   REVERSE SHOULDER ARTHROPLASTY Right 12/14/2015   Procedure: REVERSE SHOULDER ARTHROPLASTY;  Surgeon: Tania Ade, MD;  Location: [facility];  Service: Orthopedics;  Laterality: Right;  Right reverse total shoulder arthroplasty   ROBOTIC ASSISTED SUPRACERVICAL HYSTERECTOMY WITH BILATERAL SALPINGO OOPHERECTOMY  01/30/15   by Dr.  Polly Cobia at [facility]    There were no vitals filed for this visit.   Subjective Assessment - 03/19/21 0941     Subjective  Pt reprots mild pain due to the weather    Pertinent History reverse sh arthroplasty 12/2015, old clavicle fx. h/o lymphedema LLE secondary to Endometrial CA, [facility] Lt DRF 2015,    Limitations lymphedema Lt LE    Patient Stated Goals Decrease shoulder pain, increase motion/use and endurance of arm    Currently in Pain? Yes    Pain Score 2     Pain Location Shoulder    Pain Orientation Right    Pain Descriptors / Indicators Aching    Pain Type Chronic pain    Pain Onset More than a month ago    Pain Frequency Intermittent    Aggravating Factors  weather    Pain Relieving Factors heat              Treatment: Hot pack x 11 min Rt shoulder for pain. No adverse reactions  UBE x 8 min, level 4 for UB conditioning/endurance  Reviewed Theraband ex issued for posterior sh strengthening rowing and shoulder extension  Wall pushups and shoulder extension with cane min v.c  Functional reaching to place graded clothespins on vertical  antennae with RUE for overhead reach, min v.c for positioning                    OT Education - 03/19/21 0938     Education Details reveiwed theraband exercises    Person(s) Educated Patient    Methods Explanation;Demonstration;Verbal cues;Handout    Comprehension Returned demonstration;Verbalized understanding              OT Short Term Goals - 03/09/21 1422       OT SHORT TERM GOAL #1   Title Independent with HEP for RUE    Time 3    Period Weeks    Status Achieved      OT SHORT TERM GOAL #2   Title Pt to verbalize understanding of pain reduction strategies RT shoulder including proper positioning, reaching patterns, taping, and use of heat (if ok w/ lymphedema specialist)    Time 3    Period Weeks    Status Achieved               OT Long Term Goals - 03/16/21 1010       OT LONG TERM  GOAL #1   Title Pt to report improvement and less rest breaks needed with using blow dryer RUE    Time 6    Period Weeks    Status Achieved      OT LONG TERM GOAL #2   Title Pt to improve shoulder abduction to 120* with pain consistently less than or equal to 4/10    Time 6    Period Weeks    Status Achieved   140*     OT LONG TERM GOAL #3   Title Pt to perform overhead reaching in Rt sh flexion for 3 minutes w/o rest for IADLS (putting away dishes, groceries, etc)    Time 6    Period Weeks    Status Achieved                   Plan - 03/19/21 0957     Clinical Impression Statement Pt has met all goals. Pt agrees with plans for d/c.    OT Occupational Profile and History Problem Focused Assessment - Including review of records relating to presenting problem    Occupational performance deficits (Please refer to evaluation for details): ADL's;IADL's;Work    Body Structure / Function / Physical Skills ADL;ROM;IADL;Body mechanics;Strength;UE functional use;Pain;Endurance    Rehab Potential Good    Clinical Decision Making Several treatment options, min-mod task modification necessary    Comorbidities Affecting Occupational Performance: Presence of comorbidities impacting occupational performance    Comorbidities impacting occupational performance description: LYMPHEDEMA    Modification or Assistance to Complete Evaluation  No modification of tasks or assist necessary to complete eval    OT Frequency 2x / week    OT Duration 6 weeks   Over 8 week duration prn due to potential scheduling conflicts (however anticipate only 4 weeks needed)   OT Treatment/Interventions Moist Heat;DME and/or AE instruction;Self-care/ADL training;Therapeutic activities;Therapeutic exercise;Coping strategies training;Neuromuscular education;Passive range of motion;Manual Therapy;Patient/family education;Cryotherapy;Aquatic Therapy    Plan d/c OT    Consulted and Agree with Plan of Care Patient              Patient will benefit from skilled therapeutic intervention in order to improve the following deficits and impairments:   Body Structure / Function / Physical Skills: ADL, ROM, IADL, Body mechanics, Strength, UE functional use, Pain, Endurance  OCCUPATIONAL THERAPY DISCHARGE SUMMARY    Current functional level related to goals / functional outcomes: Pt has achieved all goals.   Remaining deficits: Decreased strength and ROM   Education / Equipment: Pt was educated in HEP, and pain reduction strategies. She demonstrates understanding.   Patient agrees to discharge. Patient goals were met. Patient is being discharged due to meeting the stated rehab goals..      Visit Diagnosis: Stiffness of right shoulder, not elsewhere classified  Muscle weakness (generalized)  Acute pain of right shoulder    Problem List Patient Active Problem List   Diagnosis Date Noted   S/p reverse total shoulder arthroplasty 12/14/2015   Endometrial cancer ([place]) 04/08/2015    Avram Danielson, OT/L 03/19/2021, 10:00 AM Theone Murdoch, OTR/L [phone] Phone: [phone] 2:08 PM 03/19/21  [facility] [address] [place], [place], [address] Phone: [phone]   Fax:  [phone]  Name: Kendra Fuentes MRN: [id number] Date of Birth: [birth year]
